# Patient Record
Sex: Female | Born: 1940 | Race: White | Hispanic: No | Smoking: Never smoker
Health system: Southern US, Community
[De-identification: ages and names within clinical notes are randomized; demographics above are authoritative.]

## PROBLEM LIST (undated history)

## (undated) DIAGNOSIS — M419 Scoliosis, unspecified: Secondary | ICD-10-CM

## (undated) DIAGNOSIS — M1990 Unspecified osteoarthritis, unspecified site: Secondary | ICD-10-CM

## (undated) DIAGNOSIS — Z860100 Personal history of colon polyps, unspecified: Secondary | ICD-10-CM

## (undated) DIAGNOSIS — E039 Hypothyroidism, unspecified: Secondary | ICD-10-CM

## (undated) DIAGNOSIS — I10 Essential (primary) hypertension: Secondary | ICD-10-CM

## (undated) DIAGNOSIS — K259 Gastric ulcer, unspecified as acute or chronic, without hemorrhage or perforation: Secondary | ICD-10-CM

## (undated) DIAGNOSIS — Z87440 Personal history of urinary (tract) infections: Secondary | ICD-10-CM

## (undated) DIAGNOSIS — E119 Type 2 diabetes mellitus without complications: Secondary | ICD-10-CM

## (undated) DIAGNOSIS — K219 Gastro-esophageal reflux disease without esophagitis: Secondary | ICD-10-CM

## (undated) DIAGNOSIS — M21379 Foot drop, unspecified foot: Secondary | ICD-10-CM

## (undated) DIAGNOSIS — E785 Hyperlipidemia, unspecified: Secondary | ICD-10-CM

## (undated) HISTORY — PX: CERVICAL CONE BIOPSY: SUR198

## (undated) HISTORY — PX: CARPAL TUNNEL RELEASE: SHX101

## (undated) HISTORY — PX: OTHER SURGICAL HISTORY: SHX169

---

## 1992-08-20 HISTORY — PX: ABDOMINAL HYSTERECTOMY: SHX81

## 1992-08-20 HISTORY — PX: BREAST SURGERY: SHX581

## 1994-08-20 HISTORY — PX: BACK SURGERY: SHX140

## 1998-08-20 HISTORY — PX: HERNIA REPAIR: SHX51

## 2003-08-21 HISTORY — PX: THYROIDECTOMY: SHX17

## 2005-08-20 HISTORY — PX: OTHER SURGICAL HISTORY: SHX169

## 2008-07-20 HISTORY — PX: ESOPHAGOGASTRODUODENOSCOPY: SHX1529

## 2008-08-20 HISTORY — PX: GIVENS CAPSULE STUDY: SHX5432

## 2012-04-20 HISTORY — PX: COLONOSCOPY: SHX174

## 2012-04-29 ENCOUNTER — Encounter: Payer: Self-pay | Admitting: Internal Medicine

## 2012-09-04 ENCOUNTER — Other Ambulatory Visit: Payer: Self-pay | Admitting: Neurosurgery

## 2012-09-05 ENCOUNTER — Encounter (HOSPITAL_COMMUNITY): Payer: Self-pay | Admitting: Pharmacy Technician

## 2012-09-15 ENCOUNTER — Encounter (HOSPITAL_COMMUNITY): Payer: Self-pay

## 2012-09-15 ENCOUNTER — Ambulatory Visit (HOSPITAL_COMMUNITY): Admission: RE | Admit: 2012-09-15 | Payer: Medicare Other | Source: Ambulatory Visit

## 2012-09-15 ENCOUNTER — Encounter (HOSPITAL_COMMUNITY)
Admission: RE | Admit: 2012-09-15 | Discharge: 2012-09-15 | Disposition: A | Discharge status: Home / Self Care | Payer: Medicare Other | Source: Ambulatory Visit | Attending: Neurosurgery | Admitting: Neurosurgery

## 2012-09-15 ENCOUNTER — Encounter (HOSPITAL_COMMUNITY): Admission: RE | Admit: 2012-09-15 | Payer: Medicare Other | Source: Ambulatory Visit

## 2012-09-15 HISTORY — DX: Unspecified osteoarthritis, unspecified site: M19.90

## 2012-09-15 HISTORY — DX: Hypothyroidism, unspecified: E03.9

## 2012-09-15 HISTORY — DX: Gastric ulcer, unspecified as acute or chronic, without hemorrhage or perforation: K25.9

## 2012-09-15 HISTORY — DX: Scoliosis, unspecified: M41.9

## 2012-09-15 HISTORY — DX: Essential (primary) hypertension: I10

## 2012-09-15 HISTORY — DX: Hyperlipidemia, unspecified: E78.5

## 2012-09-15 HISTORY — DX: Personal history of urinary (tract) infections: Z87.440

## 2012-09-15 HISTORY — DX: Type 2 diabetes mellitus without complications: E11.9

## 2012-09-15 HISTORY — DX: Personal history of colonic polyps: Z86.010

## 2012-09-15 HISTORY — DX: Personal history of colon polyps, unspecified: Z86.0100

## 2012-09-15 HISTORY — DX: Gastro-esophageal reflux disease without esophagitis: K21.9

## 2012-09-15 HISTORY — DX: Foot drop, unspecified foot: M21.379

## 2012-09-15 LAB — CBC
HCT: 35 % — ABNORMAL LOW (ref 36.0–46.0)
Hemoglobin: 11.8 g/dL — ABNORMAL LOW (ref 12.0–15.0)
MCHC: 33.7 g/dL (ref 30.0–36.0)
RBC: 4.04 MIL/uL (ref 3.87–5.11)
WBC: 5.7 10*3/uL (ref 4.0–10.5)

## 2012-09-15 LAB — BASIC METABOLIC PANEL
BUN: 17 mg/dL (ref 6–23)
Chloride: 101 mEq/L (ref 96–112)
GFR calc Af Amer: 80 mL/min — ABNORMAL LOW (ref >90)
GFR calc non Af Amer: 69 mL/min — ABNORMAL LOW (ref >90)
Potassium: 4.1 mEq/L (ref 3.5–5.1)
Sodium: 137 mEq/L (ref 135–145)

## 2012-09-15 LAB — SURGICAL PCR SCREEN
MRSA, PCR: NEGATIVE
Staphylococcus aureus: NEGATIVE

## 2012-09-15 LAB — ABO/RH: ABO/RH(D): A POS

## 2012-09-15 LAB — TYPE AND SCREEN
ABO/RH(D): A POS
Antibody Screen: NEGATIVE

## 2012-09-15 NOTE — Progress Notes (Addendum)
Pt doesn't have a cardiologist  Denies ever having an echo/stress test/heart cath   EKG and CXR to be requested from Dr.Jaswani--[phone]   Medical MD is Dr.Jaswani

## 2012-09-15 NOTE — Pre-Procedure Instructions (Signed)
Danielle Blair  09/15/2012   Your procedure is scheduled on:  Fri, Jan 31 @ 10:10 AM  Report to [facility] at 7:00 AM.  Call this number if you have problems the morning of surgery: [phone]   Remember:   Do not eat food or drink liquids after midnight.   Take these medicines the morning of surgery with A SIP OF WATER: Synthroid(Levothyroxine) and Pain Pill (if needed)              Stop taking your Aspirin and Fish Oil.No Goody's,Aleve,BC'S,Ibuprofen,or any herbal medication   Do not wear jewelry, make-up or nail polish.  Do not wear lotions, powders, or perfumes. You may wear deodorant.  Do not shave 48 hours prior to surgery.   Do not bring valuables to the hospital.  Contacts, dentures or bridgework may not be worn into surgery.  Leave suitcase in the car. After surgery it may be brought to your room.  For patients admitted to the hospital, checkout time is 11:00 AM the day of  discharge.   Patients discharged the day of surgery will not be allowed to drive  home.  Special Instructions: Shower using CHG 2 nights before surgery and the night before surgery.  If you shower the day of surgery use CHG.  Use special wash - you have one bottle of CHG for all showers.  You should use approximately 1/3 of the bottle for each shower.   Please read over the following fact sheets that you were given: Pain Booklet, Coughing and Deep Breathing, Blood Transfusion Information, MRSA Information and Surgical Site Infection Prevention

## 2012-09-15 NOTE — Progress Notes (Signed)
Pt MD office sent a report of cxr and ekg but not a hard copy of the reports---called back and spoke with Neysa Bonito who will refax the hard copies

## 2012-09-18 MED ORDER — CEFAZOLIN SODIUM-DEXTROSE 2-3 GM-% IV SOLR
2.0000 g | INTRAVENOUS | Status: AC
  Administered 2012-09-19 (×2): 2 g via INTRAVENOUS
  Filled 2012-09-18: qty 50

## 2012-09-19 ENCOUNTER — Inpatient Hospital Stay (HOSPITAL_COMMUNITY): Payer: Medicare Other

## 2012-09-19 ENCOUNTER — Inpatient Hospital Stay (HOSPITAL_COMMUNITY)
Admission: RE | Admit: 2012-09-19 | Discharge: 2012-09-23 | DRG: 457 | Disposition: A | Discharge status: Skilled Nursing Facility | Payer: Medicare Other | Source: Ambulatory Visit | Attending: Neurosurgery | Admitting: Neurosurgery

## 2012-09-19 ENCOUNTER — Encounter (HOSPITAL_COMMUNITY): Payer: Self-pay | Admitting: *Deleted

## 2012-09-19 ENCOUNTER — Encounter (HOSPITAL_COMMUNITY): Payer: Self-pay | Admitting: Anesthesiology

## 2012-09-19 ENCOUNTER — Inpatient Hospital Stay (HOSPITAL_COMMUNITY): Payer: Medicare Other | Admitting: Anesthesiology

## 2012-09-19 ENCOUNTER — Encounter (HOSPITAL_COMMUNITY)
Admission: RE | Disposition: A | Discharge status: Skilled Nursing Facility | Payer: Self-pay | Source: Ambulatory Visit | Attending: Neurosurgery

## 2012-09-19 DIAGNOSIS — M5126 Other intervertebral disc displacement, lumbar region: Secondary | ICD-10-CM | POA: Diagnosis present

## 2012-09-19 DIAGNOSIS — Y831 Surgical operation with implant of artificial internal device as the cause of abnormal reaction of the patient, or of later complication, without mention of misadventure at the time of the procedure: Secondary | ICD-10-CM | POA: Diagnosis present

## 2012-09-19 DIAGNOSIS — M419 Scoliosis, unspecified: Secondary | ICD-10-CM | POA: Diagnosis present

## 2012-09-19 DIAGNOSIS — M216X9 Other acquired deformities of unspecified foot: Secondary | ICD-10-CM | POA: Diagnosis present

## 2012-09-19 DIAGNOSIS — G988 Other disorders of nervous system: Secondary | ICD-10-CM | POA: Diagnosis present

## 2012-09-19 DIAGNOSIS — Y9210 Unspecified residential institution as the place of occurrence of the external cause: Secondary | ICD-10-CM | POA: Diagnosis present

## 2012-09-19 DIAGNOSIS — M4120 Other idiopathic scoliosis, site unspecified: Principal | ICD-10-CM | POA: Diagnosis present

## 2012-09-19 HISTORY — PX: TRANSFORAMINAL LUMBAR INTERBODY FUSION (TLIF) WITH PEDICLE SCREW FIXATION 4 LEVEL: SHX6144

## 2012-09-19 LAB — GLUCOSE, CAPILLARY: Glucose-Capillary: 165 mg/dL — ABNORMAL HIGH (ref 70–99)

## 2012-09-19 SURGERY — TRANSFORAMINAL LUMBAR INTERBODY FUSION (TLIF) WITH PEDICLE SCREW FIXATION 4 LEVEL
Anesthesia: General | Site: Back | Wound class: Clean

## 2012-09-19 MED ORDER — VITAMIN D-3 25 MCG (1000 UT) PO CAPS: 2.0000 | ORAL_CAPSULE | Freq: Every day | ORAL | Status: DC

## 2012-09-19 MED ORDER — PHENYLEPHRINE HCL 10 MG/ML IJ SOLN
INTRAMUSCULAR | Status: DC | PRN
  Administered 2012-09-19: 40 ug via INTRAVENOUS
  Administered 2012-09-19 (×2): 80 ug via INTRAVENOUS
  Administered 2012-09-19: 120 ug via INTRAVENOUS
  Administered 2012-09-19 (×3): 80 ug via INTRAVENOUS

## 2012-09-19 MED ORDER — LIDOCAINE-EPINEPHRINE 1 %-1:100000 IJ SOLN
INTRAMUSCULAR | Status: DC | PRN
  Administered 2012-09-19: 5 mL

## 2012-09-19 MED ORDER — KCL IN DEXTROSE-NACL 20-5-0.45 MEQ/L-%-% IV SOLN
INTRAVENOUS | Status: DC
  Administered 2012-09-19 – 2012-09-21 (×2): via INTRAVENOUS
  Filled 2012-09-19 (×4): qty 1000

## 2012-09-19 MED ORDER — HYDROMORPHONE HCL PF 1 MG/ML IJ SOLN
INTRAMUSCULAR | Status: AC
  Administered 2012-09-19: 0.5 mg via INTRAVENOUS
  Filled 2012-09-19: qty 1

## 2012-09-19 MED ORDER — SODIUM CHLORIDE 0.9 % IJ SOLN: 9.0000 mL | INTRAMUSCULAR | Status: DC | PRN

## 2012-09-19 MED ORDER — VITAMIN D3 25 MCG (1000 UNIT) PO TABS
2000.0000 [IU] | ORAL_TABLET | Freq: Every day | ORAL | Status: DC
  Administered 2012-09-19 – 2012-09-23 (×5): 2000 [IU] via ORAL
  Filled 2012-09-19 (×5): qty 2

## 2012-09-19 MED ORDER — SODIUM CHLORIDE 0.9 % IV SOLN: 250.0000 mL | INTRAVENOUS | Status: DC

## 2012-09-19 MED ORDER — ASPIRIN EC 81 MG PO TBEC
81.0000 mg | DELAYED_RELEASE_TABLET | Freq: Every day | ORAL | Status: DC
  Administered 2012-09-19 – 2012-09-23 (×5): 81 mg via ORAL
  Filled 2012-09-19 (×5): qty 1

## 2012-09-19 MED ORDER — HYDROMORPHONE HCL PF 1 MG/ML IJ SOLN
INTRAMUSCULAR | Status: DC | PRN
  Administered 2012-09-19: 1 mg via INTRAVENOUS

## 2012-09-19 MED ORDER — LIDOCAINE HCL (CARDIAC) 20 MG/ML IV SOLN
INTRAVENOUS | Status: DC | PRN
  Administered 2012-09-19: 80 mg via INTRAVENOUS

## 2012-09-19 MED ORDER — FENOFIBRATE 54 MG PO TABS
54.0000 mg | ORAL_TABLET | Freq: Every day | ORAL | Status: DC
  Administered 2012-09-19 – 2012-09-23 (×5): 54 mg via ORAL
  Filled 2012-09-19 (×5): qty 1

## 2012-09-19 MED ORDER — VITAMIN B-12 1000 MCG PO TABS
1000.0000 ug | ORAL_TABLET | Freq: Every day | ORAL | Status: DC
  Administered 2012-09-19 – 2012-09-23 (×5): 1000 ug via ORAL
  Filled 2012-09-19 (×5): qty 1

## 2012-09-19 MED ORDER — SENNOSIDES-DOCUSATE SODIUM 8.6-50 MG PO TABS: 1.0000 | ORAL_TABLET | Freq: Every evening | ORAL | Status: DC | PRN

## 2012-09-19 MED ORDER — LORATADINE 10 MG PO TABS
10.0000 mg | ORAL_TABLET | Freq: Every day | ORAL | Status: DC
  Administered 2012-09-19 – 2012-09-23 (×5): 10 mg via ORAL
  Filled 2012-09-19 (×5): qty 1

## 2012-09-19 MED ORDER — DIPHENHYDRAMINE HCL 50 MG/ML IJ SOLN
12.5000 mg | Freq: Four times a day (QID) | INTRAMUSCULAR | Status: DC | PRN
  Administered 2012-09-20: 12.5 mg via INTRAVENOUS
  Filled 2012-09-19: qty 1

## 2012-09-19 MED ORDER — HYDROMORPHONE HCL PF 1 MG/ML IJ SOLN
INTRAMUSCULAR | Status: AC
  Filled 2012-09-19: qty 1

## 2012-09-19 MED ORDER — ACETAMINOPHEN 650 MG RE SUPP: 650.0000 mg | RECTAL | Status: DC | PRN

## 2012-09-19 MED ORDER — ZOLPIDEM TARTRATE 5 MG PO TABS: 5.0000 mg | ORAL_TABLET | Freq: Every evening | ORAL | Status: DC | PRN

## 2012-09-19 MED ORDER — ACETAMINOPHEN 10 MG/ML IV SOLN
INTRAVENOUS | Status: AC
  Administered 2012-09-19: 1000 mg via INTRAVENOUS
  Filled 2012-09-19: qty 100

## 2012-09-19 MED ORDER — ONDANSETRON HCL 4 MG/2ML IJ SOLN
INTRAMUSCULAR | Status: DC | PRN
  Administered 2012-09-19: 4 mg via INTRAVENOUS

## 2012-09-19 MED ORDER — INSULIN ASPART 100 UNIT/ML ~~LOC~~ SOLN
0.0000 [IU] | Freq: Three times a day (TID) | SUBCUTANEOUS | Status: DC
  Administered 2012-09-20 – 2012-09-23 (×4): 2 [IU] via SUBCUTANEOUS

## 2012-09-19 MED ORDER — PHENOL 1.4 % MT LIQD: 1.0000 | OROMUCOSAL | Status: DC | PRN

## 2012-09-19 MED ORDER — MIDAZOLAM HCL 5 MG/5ML IJ SOLN
INTRAMUSCULAR | Status: DC | PRN
  Administered 2012-09-19 (×2): 1 mg via INTRAVENOUS

## 2012-09-19 MED ORDER — GLIPIZIDE ER 2.5 MG PO TB24
2.5000 mg | ORAL_TABLET | Freq: Every day | ORAL | Status: DC
  Administered 2012-09-19 – 2012-09-23 (×5): 2.5 mg via ORAL
  Filled 2012-09-19 (×5): qty 1

## 2012-09-19 MED ORDER — VECURONIUM BROMIDE 10 MG IV SOLR
INTRAVENOUS | Status: DC | PRN
  Administered 2012-09-19 (×5): 1 mg via INTRAVENOUS
  Administered 2012-09-19: 2 mg via INTRAVENOUS
  Administered 2012-09-19 (×5): 1 mg via INTRAVENOUS

## 2012-09-19 MED ORDER — LISINOPRIL 2.5 MG PO TABS
2.5000 mg | ORAL_TABLET | Freq: Every day | ORAL | Status: DC
  Administered 2012-09-19 – 2012-09-23 (×3): 2.5 mg via ORAL
  Filled 2012-09-19 (×5): qty 1

## 2012-09-19 MED ORDER — DIPHENHYDRAMINE HCL 12.5 MG/5ML PO ELIX: 12.5000 mg | ORAL_SOLUTION | Freq: Four times a day (QID) | ORAL | Status: DC | PRN

## 2012-09-19 MED ORDER — BUPIVACAINE HCL (PF) 0.5 % IJ SOLN
INTRAMUSCULAR | Status: DC | PRN
  Administered 2012-09-19: 5 mL

## 2012-09-19 MED ORDER — 0.9 % SODIUM CHLORIDE (POUR BTL) OPTIME
TOPICAL | Status: DC | PRN
  Administered 2012-09-19: 1000 mL

## 2012-09-19 MED ORDER — SODIUM CHLORIDE 0.9 % IJ SOLN: 3.0000 mL | INTRAMUSCULAR | Status: DC | PRN

## 2012-09-19 MED ORDER — NEOSTIGMINE METHYLSULFATE 1 MG/ML IJ SOLN
INTRAMUSCULAR | Status: DC | PRN
  Administered 2012-09-19: 3.5 mg via INTRAVENOUS

## 2012-09-19 MED ORDER — MENTHOL 3 MG MT LOZG: 1.0000 | LOZENGE | OROMUCOSAL | Status: DC | PRN

## 2012-09-19 MED ORDER — DOCUSATE SODIUM 100 MG PO CAPS
100.0000 mg | ORAL_CAPSULE | Freq: Two times a day (BID) | ORAL | Status: DC
  Administered 2012-09-19 – 2012-09-23 (×8): 100 mg via ORAL
  Filled 2012-09-19 (×5): qty 1

## 2012-09-19 MED ORDER — CEFAZOLIN SODIUM 1-5 GM-% IV SOLN
1.0000 g | Freq: Three times a day (TID) | INTRAVENOUS | Status: AC
  Administered 2012-09-19 – 2012-09-20 (×2): 1 g via INTRAVENOUS
  Filled 2012-09-19 (×2): qty 50

## 2012-09-19 MED ORDER — ALBUMIN HUMAN 5 % IV SOLN
INTRAVENOUS | Status: DC | PRN
  Administered 2012-09-19: 13:00:00 via INTRAVENOUS

## 2012-09-19 MED ORDER — MORPHINE SULFATE (PF) 1 MG/ML IV SOLN
INTRAVENOUS | Status: AC
  Filled 2012-09-19: qty 25

## 2012-09-19 MED ORDER — BISACODYL 10 MG RE SUPP
10.0000 mg | Freq: Every day | RECTAL | Status: DC | PRN
  Administered 2012-09-20: 10 mg via RECTAL
  Filled 2012-09-19: qty 1

## 2012-09-19 MED ORDER — ONDANSETRON HCL 4 MG/2ML IJ SOLN: 4.0000 mg | Freq: Four times a day (QID) | INTRAMUSCULAR | Status: DC | PRN

## 2012-09-19 MED ORDER — DIAZEPAM 5 MG PO TABS
5.0000 mg | ORAL_TABLET | Freq: Four times a day (QID) | ORAL | Status: DC | PRN
  Administered 2012-09-20 – 2012-09-21 (×2): 5 mg via ORAL
  Filled 2012-09-19 (×2): qty 1

## 2012-09-19 MED ORDER — METFORMIN HCL 500 MG PO TABS
1000.0000 mg | ORAL_TABLET | Freq: Two times a day (BID) | ORAL | Status: DC
  Administered 2012-09-20 – 2012-09-23 (×7): 1000 mg via ORAL
  Filled 2012-09-19 (×9): qty 2

## 2012-09-19 MED ORDER — NALOXONE HCL 0.4 MG/ML IJ SOLN: 0.4000 mg | INTRAMUSCULAR | Status: DC | PRN

## 2012-09-19 MED ORDER — HYDROCODONE-ACETAMINOPHEN 5-325 MG PO TABS: 1.0000 | ORAL_TABLET | ORAL | Status: DC | PRN

## 2012-09-19 MED ORDER — OXYCODONE-ACETAMINOPHEN 5-325 MG PO TABS
1.0000 | ORAL_TABLET | ORAL | Status: DC | PRN
  Administered 2012-09-20 – 2012-09-21 (×2): 2 via ORAL
  Filled 2012-09-19 (×3): qty 2

## 2012-09-19 MED ORDER — SODIUM CHLORIDE 0.9 % IJ SOLN: 3.0000 mL | Freq: Two times a day (BID) | INTRAMUSCULAR | Status: DC

## 2012-09-19 MED ORDER — ROCURONIUM BROMIDE 100 MG/10ML IV SOLN
INTRAVENOUS | Status: DC | PRN
  Administered 2012-09-19: 40 mg via INTRAVENOUS
  Administered 2012-09-19: 10 mg via INTRAVENOUS

## 2012-09-19 MED ORDER — ONDANSETRON HCL 4 MG/2ML IJ SOLN: 4.0000 mg | INTRAMUSCULAR | Status: DC | PRN

## 2012-09-19 MED ORDER — GLYCOPYRROLATE 0.2 MG/ML IJ SOLN
INTRAMUSCULAR | Status: DC | PRN
  Administered 2012-09-19: .5 mg via INTRAVENOUS

## 2012-09-19 MED ORDER — FENTANYL CITRATE 0.05 MG/ML IJ SOLN
INTRAMUSCULAR | Status: DC | PRN
  Administered 2012-09-19: 25 ug via INTRAVENOUS
  Administered 2012-09-19: 150 ug via INTRAVENOUS
  Administered 2012-09-19: 50 ug via INTRAVENOUS
  Administered 2012-09-19: 25 ug via INTRAVENOUS
  Administered 2012-09-19 (×2): 50 ug via INTRAVENOUS
  Administered 2012-09-19: 25 ug via INTRAVENOUS

## 2012-09-19 MED ORDER — PROPOFOL 10 MG/ML IV BOLUS
INTRAVENOUS | Status: DC | PRN
  Administered 2012-09-19: 170 mg via INTRAVENOUS

## 2012-09-19 MED ORDER — LEVOTHYROXINE SODIUM 125 MCG PO TABS
125.0000 ug | ORAL_TABLET | Freq: Every day | ORAL | Status: DC
  Administered 2012-09-20 – 2012-09-23 (×4): 125 ug via ORAL
  Filled 2012-09-19 (×6): qty 1

## 2012-09-19 MED ORDER — THROMBIN 20000 UNITS EX SOLR
CUTANEOUS | Status: DC | PRN
  Administered 2012-09-19: 11:00:00 via TOPICAL

## 2012-09-19 MED ORDER — CEFAZOLIN SODIUM-DEXTROSE 2-3 GM-% IV SOLR
INTRAVENOUS | Status: AC
  Filled 2012-09-19: qty 50

## 2012-09-19 MED ORDER — SENNA 8.6 MG PO TABS
1.0000 | ORAL_TABLET | Freq: Two times a day (BID) | ORAL | Status: DC
  Administered 2012-09-19 – 2012-09-23 (×8): 8.6 mg via ORAL
  Filled 2012-09-19 (×8): qty 1

## 2012-09-19 MED ORDER — HYDROMORPHONE HCL PF 1 MG/ML IJ SOLN
0.2500 mg | INTRAMUSCULAR | Status: DC | PRN
  Administered 2012-09-19 (×3): 0.5 mg via INTRAVENOUS

## 2012-09-19 MED ORDER — ALUM & MAG HYDROXIDE-SIMETH 200-200-20 MG/5ML PO SUSP: 30.0000 mL | Freq: Four times a day (QID) | ORAL | Status: DC | PRN

## 2012-09-19 MED ORDER — INSULIN ASPART 100 UNIT/ML ~~LOC~~ SOLN: 0.0000 [IU] | Freq: Every day | SUBCUTANEOUS | Status: DC

## 2012-09-19 MED ORDER — LACTATED RINGERS IV SOLN: INTRAVENOUS | Status: DC | PRN

## 2012-09-19 MED ORDER — MORPHINE SULFATE (PF) 1 MG/ML IV SOLN
INTRAVENOUS | Status: DC
Start: 2012-09-19 — End: 2012-09-20
  Administered 2012-09-19: via INTRAVENOUS
  Administered 2012-09-19: 6 mg via INTRAVENOUS
  Administered 2012-09-19: 17:00:00 via INTRAVENOUS
  Administered 2012-09-19: 17.29 mg via INTRAVENOUS
  Administered 2012-09-20: 9 mg via INTRAVENOUS
  Administered 2012-09-20: 10.5 mg via INTRAVENOUS
  Filled 2012-09-19: qty 25

## 2012-09-19 MED ORDER — FLEET ENEMA 7-19 GM/118ML RE ENEM: 1.0000 | ENEMA | Freq: Once | RECTAL | Status: AC | PRN

## 2012-09-19 MED ORDER — ACETAMINOPHEN 325 MG PO TABS: 650.0000 mg | ORAL_TABLET | ORAL | Status: DC | PRN

## 2012-09-19 MED ORDER — LACTATED RINGERS IV SOLN
INTRAVENOUS | Status: DC | PRN
  Administered 2012-09-19 (×3): via INTRAVENOUS

## 2012-09-19 MED ORDER — OXYCODONE-ACETAMINOPHEN 5-325 MG PO TABS: 1.0000 | ORAL_TABLET | ORAL | Status: DC | PRN

## 2012-09-19 MED ORDER — BACITRACIN ZINC 500 UNIT/GM EX OINT
TOPICAL_OINTMENT | CUTANEOUS | Status: DC | PRN
  Administered 2012-09-19: 1 via TOPICAL

## 2012-09-19 SURGICAL SUPPLY — 82 items
BAG DECANTER FOR FLEXI CONT (MISCELLANEOUS) IMPLANT
BENZOIN TINCTURE PRP APPL 2/3 (GAUZE/BANDAGES/DRESSINGS) ×2 IMPLANT
BLADE SURG ROTATE 9660 (MISCELLANEOUS) IMPLANT
BONE VOID FILLER STRIP 10CC (Bone Implant) ×2 IMPLANT
BUR MATCHSTICK NEURO 3.0 LAGG (BURR) ×2 IMPLANT
BUR PRECISION FLUTE 5.0 (BURR) ×2 IMPLANT
CAGE TLIF 8MM (Cage) ×4 IMPLANT
CANISTER SUCTION 2500CC (MISCELLANEOUS) ×2 IMPLANT
CLOTH BEACON ORANGE TIMEOUT ST (SAFETY) ×2 IMPLANT
CONT SPEC 4OZ CLIKSEAL STRL BL (MISCELLANEOUS) ×4 IMPLANT
COVER BACK TABLE 24X17X13 BIG (DRAPES) IMPLANT
COVER TABLE BACK 60X90 (DRAPES) ×2 IMPLANT
DERMABOND ADHESIVE PROPEN (GAUZE/BANDAGES/DRESSINGS) ×1
DERMABOND ADVANCED (GAUZE/BANDAGES/DRESSINGS)
DERMABOND ADVANCED .7 DNX12 (GAUZE/BANDAGES/DRESSINGS) IMPLANT
DERMABOND ADVANCED .7 DNX6 (GAUZE/BANDAGES/DRESSINGS) ×1 IMPLANT
DRAPE C-ARM 42X72 X-RAY (DRAPES) ×6 IMPLANT
DRAPE LAPAROTOMY 100X72X124 (DRAPES) ×2 IMPLANT
DRAPE POUCH INSTRU U-SHP 10X18 (DRAPES) ×2 IMPLANT
DRAPE SURG 17X23 STRL (DRAPES) ×2 IMPLANT
DRESSING TELFA 8X3 (GAUZE/BANDAGES/DRESSINGS) ×2 IMPLANT
DURAFORM COLLAGEN 1X1 5-PACK (Neuro Prosthesis/Implant) ×4 IMPLANT
DURAPREP 26ML APPLICATOR (WOUND CARE) ×2 IMPLANT
ELECT REM PT RETURN 9FT ADLT (ELECTROSURGICAL) ×2
ELECTRODE REM PT RTRN 9FT ADLT (ELECTROSURGICAL) ×1 IMPLANT
EVACUATOR 1/8 PVC DRAIN (DRAIN) ×2 IMPLANT
GAUZE SPONGE 4X4 16PLY XRAY LF (GAUZE/BANDAGES/DRESSINGS) IMPLANT
GLOVE BIO SURGEON STRL SZ8 (GLOVE) ×4 IMPLANT
GLOVE BIOGEL PI IND STRL 7.0 (GLOVE) ×2 IMPLANT
GLOVE BIOGEL PI IND STRL 8 (GLOVE) ×4 IMPLANT
GLOVE BIOGEL PI IND STRL 8.5 (GLOVE) ×3 IMPLANT
GLOVE BIOGEL PI INDICATOR 7.0 (GLOVE) ×2
GLOVE BIOGEL PI INDICATOR 8 (GLOVE) ×4
GLOVE BIOGEL PI INDICATOR 8.5 (GLOVE) ×3
GLOVE ECLIPSE 8.0 STRL XLNG CF (GLOVE) ×8 IMPLANT
GLOVE ECLIPSE 8.5 STRL (GLOVE) ×2 IMPLANT
GLOVE EXAM NITRILE LRG STRL (GLOVE) IMPLANT
GLOVE EXAM NITRILE MD LF STRL (GLOVE) IMPLANT
GLOVE EXAM NITRILE XL STR (GLOVE) IMPLANT
GLOVE EXAM NITRILE XS STR PU (GLOVE) IMPLANT
GLOVE SURG SS PI 6.5 STRL IVOR (GLOVE) ×10 IMPLANT
GOWN BRE IMP SLV AUR LG STRL (GOWN DISPOSABLE) IMPLANT
GOWN BRE IMP SLV AUR XL STRL (GOWN DISPOSABLE) ×8 IMPLANT
GOWN STRL REIN 2XL LVL4 (GOWN DISPOSABLE) ×8 IMPLANT
KIT BASIN OR (CUSTOM PROCEDURE TRAY) ×2 IMPLANT
KIT INFUSE MEDIUM (Orthopedic Implant) ×2 IMPLANT
KIT POSITION SURG JACKSON T1 (MISCELLANEOUS) ×2 IMPLANT
KIT ROOM TURNOVER OR (KITS) ×2 IMPLANT
MILL MEDIUM DISP (BLADE) ×2 IMPLANT
NEEDLE HYPO 25X1 1.5 SAFETY (NEEDLE) ×2 IMPLANT
NEEDLE SPNL 18GX3.5 QUINCKE PK (NEEDLE) IMPLANT
NS IRRIG 1000ML POUR BTL (IV SOLUTION) ×2 IMPLANT
PACK LAMINECTOMY NEURO (CUSTOM PROCEDURE TRAY) ×2 IMPLANT
PAD ARMBOARD 7.5X6 YLW CONV (MISCELLANEOUS) ×6 IMPLANT
PATTIES SURGICAL .5 X.5 (GAUZE/BANDAGES/DRESSINGS) IMPLANT
PATTIES SURGICAL .5 X1 (DISPOSABLE) IMPLANT
PATTIES SURGICAL 1X1 (DISPOSABLE) IMPLANT
ROD CP TI 5.5X11CM (Rod) ×4 IMPLANT
SCREW 50MM (Screw) ×14 IMPLANT
SCREW PEDICLE 45MM (Screw) ×4 IMPLANT
SCREW POLYAX 6.5X45MM (Screw) ×2 IMPLANT
SCREW SET SPINAL STD HEXALOBE (Screw) ×22 IMPLANT
SPONGE GAUZE 4X4 12PLY (GAUZE/BANDAGES/DRESSINGS) ×2 IMPLANT
SPONGE LAP 4X18 X RAY DECT (DISPOSABLE) ×2 IMPLANT
SPONGE SURGIFOAM ABS GEL 100 (HEMOSTASIS) ×2 IMPLANT
STAPLER SKIN PROX WIDE 3.9 (STAPLE) IMPLANT
STRIP CLOSURE SKIN 1/2X4 (GAUZE/BANDAGES/DRESSINGS) ×2 IMPLANT
SUT ETHILON 3 0 FSL (SUTURE) ×2 IMPLANT
SUT VIC AB 1 CT1 18XBRD ANBCTR (SUTURE) ×2 IMPLANT
SUT VIC AB 1 CT1 8-18 (SUTURE) ×2
SUT VIC AB 2-0 CT1 18 (SUTURE) ×4 IMPLANT
SUT VIC AB 3-0 SH 8-18 (SUTURE) ×4 IMPLANT
SYR 20CC LL (SYRINGE) ×2 IMPLANT
SYR 3ML LL SCALE MARK (SYRINGE) ×8 IMPLANT
SYR 5ML LL (SYRINGE) IMPLANT
TAPE CLOTH SURG 4X10 WHT LF (GAUZE/BANDAGES/DRESSINGS) ×2 IMPLANT
TLIF 9MM (Spine Construct) ×2 IMPLANT
TOWEL OR 17X24 6PK STRL BLUE (TOWEL DISPOSABLE) ×2 IMPLANT
TOWEL OR 17X26 10 PK STRL BLUE (TOWEL DISPOSABLE) ×2 IMPLANT
TRAP SPECIMEN MUCOUS 40CC (MISCELLANEOUS) ×2 IMPLANT
TRAY FOLEY CATH 14FRSI W/METER (CATHETERS) ×2 IMPLANT
WATER STERILE IRR 1000ML POUR (IV SOLUTION) ×2 IMPLANT

## 2012-09-19 NOTE — Brief Op Note (Signed)
09/19/2012  4:33 PM  PATIENT:  Danielle Blair  72 y.o. 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 She has a severe left L5 radiculopathy with foot drop related to her prior laminectomy surgery. It was elected to take her to surgery for redo decompression and fusion from L2/3 through L5/S1 levels.   Procedure: Patient was placed in a prone position on the [place] table after smooth and uncomplicated induction of general endotracheal anesthesia. Her low back was prepped and draped in usual sterile fashion with betadine scrub and DuraPrep. Area of incision was infiltrated  with local lidocaine. Incision was made to the lumbodorsal fascia was incised and exposure was performed of the L2-S1 spinous processes laminae facet joint and transverse processes. Intraoperative x-ray was obtained which confirmed correct orientation with marker probes at L2 through sacral levels. A total laminectomy of L2 through L5 levels was performed with disarticulation of the facet joints and thorough decompression was performed of left L2, L3, L4, L5, S1   S1 nerve roots along with the common dural tube. Decompression was greater than would be typical for PLIF. A thorough discectomy with removal  of fragments at L3/4 and L4/5 level was performed.  There was dense scar tissue around the L5 nerve root on the left.   After trial sizing and utilization of sequential shavers, interspaces were packed with BMP, autograft and PEEK cages with NexOss Bone graft extender.  Bone autograft was packed within the interspace bilaterally along with medium BMP kit and NexOss bone graft extender. TLIF medium 9 mm peek cage was packed with BMP and extender and was inserted the interspace and countersunk appropriately at the L45 level, 8 mm cage at L 4/5, and 8 mm cages at  L3/4 levels. 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  This was covered with Duragen.  There was no evidence of persistent CSF leakage. This dural defect was not felt to be repairable due to its location along the nerve root sleeve. There was a densely calcified disc at L 23, so after thorough decompression, it was elected not to place an interbody graft at this level. There was good correction   of scoliotic curvature. The posterolateral region was extensively decorticated and pedicle probes were placed at L2 through S1 bilaterally. Intraoperative fluoroscopy confirmed correct orientationin the AP and lateral plane. 50 x 6.5 mm pedicle screws were placed at S1 bilaterally  and 50 x 6.5 mm screws placed at L 3, L 4, L 5  bilaterally and 6.5 x 50mm screws were placed at L 2 on the right and 6.5 x 45 mm screw was placed at L 2 on the left.  Final x-rays demonstrated well-positioned interbody grafts and pedicle screw fixation. A 110 mm lordotic rod was placed on the right and a 110 mm rod was placed on the left locked down in situ and the posterolateral region was packed with the remaining NexOss and bone graft extender on the right  Along with bone autograft on the left. The wound was irrigated. A medium Hemovac drain was placed and anchored with a stitch. Fascia was closed with 1 Vicryl sutures skin edges were reapproximated 2 and 3-0 Vicryl sutures, followed by 3-0 nylon stitch to reapproximate the skin edges.The wound was dressed with bacitracin, Telfa gauze and tape the patient was extubated in the operating room and taken to recovery in stable satisfactory condition. She tolerated traction well counts were correct at the end of the case.   PLAN OF CARE: Admit to inpatient   PATIENT DISPOSITION:  PACU - hemodynamically stable.   Delay start of Pharmacological VTE agent (>24hrs) due to surgical blood loss or risk of bleeding: yes  

## 2012-09-19 NOTE — H&P (Signed)
Danielle Blair. Danielle Blair  #[id number] DOB:  11-26-1940   09/03/2012:     Danielle Blair comes in today.  She is quite miserable.  We went over her MRI of her lumbar spine which demonstrates transitional anatomy with advanced multilevel degenerative changes.  At L1-2, there is felt to be disc desiccation and superimposed central protrusion nearly completely effacing the anterior thecal sac.  At L2-3, there is moderate biforaminal narrowing right greater than left with moderate to severe central stenosis.  At L3-4, there is diffuse disc bulging superimposed left lateral and left foraminal broad-based protrusions causing moderate right and severe left foraminal narrowing.  At L4-5, there is disc space narrowing with superimposed broad-based left paracentral and left foraminal protrusion causing moderate right and moderately severe left foraminal narrowing.    Based on my review of her imaging including her plain radiographs she has evidence of scoliosis and significant degeneration L2-S1 level with significant neural foraminal stenosis.  She has had previous laminectomy.  We talked about treatment options. She says she cannot live with her current level of pain and wants to get something done.  Based on my review of her studies and her physical examination, I have recommended that she undergo re-do decompression and fusion L2-S1 levels.  She is fitted for an LSO brace.  We went over the surgery in detail with her.  She has had two Medrol Dosepaks which did not give her a great deal of relief.  She said she fell in October and had a right hand injury without fracture.  She said she continues to have severe pain in the low back, left thigh to her calf and is cold below her left Blair.  She is having to take Percocet 5/325 1-2 every four hours as needed for pain.    I went over the surgical procedure with the patient in great detail today.  I went over the diagnostic studies, as well as surgical models.  We discussed the  potential risks and benefits of the surgery, as well as expected hospital course.  The patient would generally wear a brace for three months after surgery.  The risks include, but are not limited to, bleeding, possible need for transfusion, infection, damage to nerves and vessels, risks of anesthesia, dural tear, injury to lumbar nerve roots causing either temporary or permanent leg pain, numbness or weakness, malpositioning of instrumentation, fusion failure, failure to relieve pain, back pain after surgery, recurrent disc herniation, worsening of pain and adjacent degeneration after a spinal fusion.  Also, the potential for the need for further surgery in the event of incomplete fusion.  We also discussed the risks of injury to abdominal structures including bowel, iliac artery or vein injury.    Risks and benefits were discussed and she wishes to proceed.  I met with the patient at great length and later she met with Danielle Cocker, RN to discuss treatment plans and my recommendations.           Danielle Blair. 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  She is Danielle Blair's sister-in-law and Danielle Blair mother-in-law both of whom are patients of mine.  She is a 72 year old retired woman with low back pain.  She says it has been increasing frequently but she has increased pain with periods of activity. She says that occasionally she  gets lightening pain into her left leg and left foot and also tightness in her left foot since 1996.  She says the pain had gotten much worse since 12/14/2011.  She says in 2/96 she had left leg weakness and left footdrop due to a disc.  She uses an AFO since then.  She had disc surgery at [facility] by Dr. Adriana Simas in 2/96 but did not get a lot of improvement in her left foot weakness.  She also underwent left foot surgery in 3/97 and thyroid surgery in 12/2003.  She uses  Advil on an as needed basis.    Danielle Blair said her foot never got better after her back surgery.  She said she had pain after sitting on bleachers on 12/14/2011 and she says that usually she is able to relieve the pain at the lumbosacral junction with extension.    REVIEW OF SYSTEMS:   A detailed Review of Systems sheet was reviewed with the patient.  Pertinent positives include: under eyes-she wears glasses; under Cardiovascular-she notes high cholesterol; under Musculoskeletal-she notes leg weakness and back pain; under Endocrine-she notes diabetes and thyroid dysfunction.  All other systems are negative; this includes Constitutional symptoms, Ears, nose, mouth, throat, Respiratory, Gastrointestinal, Genitourinary, Integumentary & Breast, Neurologic, Psychiatric, Hematologic/Lymphatic, Allergic/Immunologic.    PAST MEDICAL HISTORY:      Current Medical Conditions:    As previously described.  She has a history of noninsulin dependent diabetes mellitus, hypercholesterolemia and thyroid dysfunction.      Prior Operations and Hospitalizations:   As previously described.      Medications and Allergies:  Synthroid 125 mcg q.d., Fenofibrate 145 mg. q.d., Metformin 1000 mg. b.i.d., Januvia 100 mg. q.d., Aspirin 81 mg. q.d., Caltrate 600+D b.i.d., Vitamin D 2000 IU q.d., Vitamin B12 1000 mcg. q.d., Biotin 5000 mcg q.d., Flush Free Niacin 500 mg. 4 q.d., Fish oil 1000 mg. b.i.d. and Cetirizine 10 mg. q.d.  There are no known allergies.    Height and Weight:     5'5" tall and 178 pounds.      FAMILY HISTORY:    Both parents are deceased.  Father died of lung cancer at age 70. Mother died at the age of 42 with high blood pressure and she had a pacemaker.     SOCIAL HISTORY:    She denies tobacco, alcohol or drug use.    DIAGNOSTIC STUDIES:   She has radiographs of the lumbar spine which show levoconvexed scoliosis of her lumbar spine into her thoracic spine with arthritis and facet joint arthropathy L3-S1  levels without significant spondylolisthesis and she has retrolisthesis of L2 on L3 and L3 on L4 with disc degeneration.      PHYSICAL EXAMINATION:      General Appearance:   Danielle Blair is a pleasant and cooperative woman in no acute distress.        Blood Pressure, Pulse, Respirations:   138/80, heart rate 78 and regular, respirations 18.      HEENT - normocephalic, atraumatic.  The pupils are equal, round and reactive to light.  The extraocular muscles are intact.  Sclerae - white.  Conjunctiva - pink.  Oropharynx benign.  Uvula midline.     Neck - there are no masses, meningismus, deformities, tracheal deviation, jugular vein distention or carotid bruits.  There is normal cervical range of motion.  Spurlings' test is negative without reproducible radicular pain turning the patient's head to either side.  Lhermitte's sign is not present with axial compression.  Respiratory - there is normal respiratory effort with good intercostal function.  Lungs are clear to auscultation.  There are no rales, rhonchi or wheezes.      Cardiovascular - the heart has regular rate and rhythm to auscultation.  No murmurs are appreciated.  There is no extremity edema, cyanosis or clubbing.  There are palpable pedal pulses.      Abdomen - soft, nontender, no hepatosplenomegaly appreciated or masses.  There are active bowel sounds.  No guarding or rebound.      Musculoskeletal Examination - she rolls her foot laterally and wears and AFO and limps when she walks. 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  She has a left-sided footdrop.  She   has negative straight leg raise bilaterally.  She has pain at the lumbosacral junction to palpation.    NEUROLOGICAL EXAMINATION: The patient is oriented to time, person and place and has good recall of both recent and remote memory with normal attention span and concentration.  The patient speaks with clear and fluent speech and exhibits normal language function and  appropriate fund of knowledge.      Cranial Nerve Examination - pupils are equal, round and reactive to light.  Extraocular movements are full.  Visual fields are full to confrontational testing.  Facial sensation and facial movement are symmetric and intact.  Hearing is intact to finger rub.  Palate is upgoing.  Shoulder shrug is symmetric.  Tongue protrudes in the midline.      Motor Examination - motor strength is 5/5 in the bilateral deltoids, biceps, triceps, handgrips, wrist extensors, interosseous.  In the lower extremities motor strength is 5/5 in hip flexion, extension, quadriceps, hamstrings, plantar flexion, dorsiflexion and extensor hallucis longus. She has a left footdrop and left hip abductor strength is also decreased at 4+/5.     Sensory Examination - she has decreased pin sensation in the left L5 distribution.      Deep Tendon Reflexes - 3 in the biceps, triceps, and brachioradialis, 3 in the knees, 3 in the ankles.  The great toes are downgoing to plantar stimulation.  No pathologic reflexes.       Cerebellar Examination - normal coordination in upper and lower extremities and normal rapid alternating movements.  Romberg test is negative.    IMPRESSION AND RECOMMENDATIONS: Zaina Jenkin is a 72 year old woman with lumbar scoliosis and a left footdrop.  She has low back pain.  At this point, I have recommended that she try some physical therapy and will see how she does with that.  If she is still having problems, we will go ahead with a new MRI of her lumbar spine.  Otherwise, I do not feel compelled to order that.  She is going to see how she does with that and will followup with me as needed.  I advised her to get on a home exercise program.  I spent 60 minutes in consultation and patient care with this patient.    [facility]    Danielle Blair. Danielle Blair, M.D.

## 2012-09-19 NOTE — Transfer of Care (Signed)
Immediate Anesthesia Transfer of Care Note  Patient: Danielle Blair  Procedure(s) Performed: Procedure(s) (LRB) with comments: TRANSFORAMINAL LUMBAR INTERBODY FUSION (TLIF) WITH PEDICLE SCREW FIXATION 4 LEVEL (N/A) - Lumbar two to sacral one redo laminectomy with a decompression and transforaminal lumbar interbody fusion  Patient Location: PACU  Anesthesia Type:General  Level of Consciousness: awake, alert  and oriented  Airway & Oxygen Therapy: Patient Spontanous Breathing and Patient connected to nasal cannula oxygen  Post-op Assessment: Report given to PACU RN and Post -op Vital signs reviewed and stable  Post vital signs: Reviewed and stable  Complications: No apparent anesthesia complications

## 2012-09-19 NOTE — Progress Notes (Signed)
Awake, alert, conversat.  Full strength except persistent left foot drop.  Doing well.

## 2012-09-19 NOTE — Plan of Care (Signed)
Problem: Consults Goal: Diagnosis - Spinal Surgery Thoraco/Lumbar Spine Fusion     

## 2012-09-19 NOTE — Anesthesia Postprocedure Evaluation (Signed)
  Anesthesia Post-op Note  Patient: Danielle Blair  Procedure(s) Performed: Procedure(s) (LRB) with comments: TRANSFORAMINAL LUMBAR INTERBODY FUSION (TLIF) WITH PEDICLE SCREW FIXATION 4 LEVEL (N/A) - Lumbar two to sacral one redo laminectomy with a decompression and transforaminal lumbar interbody fusion  Patient Location: PACU  Anesthesia Type:General  Level of Consciousness: awake  Airway and Oxygen Therapy: Patient Spontanous Breathing  Post-op Pain: mild  Post-op Assessment: Post-op Vital signs reviewed  Post-op Vital Signs: Reviewed  Complications: No apparent anesthesia complications

## 2012-09-19 NOTE — Progress Notes (Signed)
Confirmed Full dose PCA with Dr. Venetia Maxon.

## 2012-09-19 NOTE — Op Note (Signed)
09/19/2012  4:33 PM  PATIENT:  Danielle Blair  72 y.o. 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 She has a severe left L5 radiculopathy with foot drop related to her prior laminectomy surgery. It was elected to take her to surgery for redo decompression and fusion from L2/3 through L5/S1 levels.   Procedure: Patient was placed in a prone position on the [place] table after smooth and uncomplicated induction of general endotracheal anesthesia. Her low back was prepped and draped in usual sterile fashion with betadine scrub and DuraPrep. Area of incision was infiltrated  with local lidocaine. Incision was made to the lumbodorsal fascia was incised and exposure was performed of the L2-S1 spinous processes laminae facet joint and transverse processes. Intraoperative x-ray was obtained which confirmed correct orientation with marker probes at L2 through sacral levels. A total laminectomy of L2 through L5 levels was performed with disarticulation of the facet joints and thorough decompression was performed of left L2, L3, L4, L5, S1   S1 nerve roots along with the common dural tube. Decompression was greater than would be typical for PLIF. A thorough discectomy with removal  of fragments at L3/4 and L4/5 level was performed.  There was dense scar tissue around the L5 nerve root on the left.   After trial sizing and utilization of sequential shavers, interspaces were packed with BMP, autograft and PEEK cages with NexOss Bone graft extender.  Bone autograft was packed within the interspace bilaterally along with medium BMP kit and NexOss bone graft extender. TLIF medium 9 mm peek cage was packed with BMP and extender and was inserted the interspace and countersunk appropriately at the L45 level, 8 mm cage at L 4/5, and 8 mm cages at  L3/4 levels. 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  This was covered with Duragen.  There was no evidence of persistent CSF leakage. This dural defect was not felt to be repairable due to its location along the nerve root sleeve. There was a densely calcified disc at L 23, so after thorough decompression, it was elected not to place an interbody graft at this level. There was good correction  of scoliotic curvature. The posterolateral region was extensively decorticated and pedicle probes were placed at L2 through S1 bilaterally. Intraoperative fluoroscopy confirmed correct orientationin the AP and lateral plane. 50 x 6.5 mm pedicle screws were placed at S1 bilaterally  and 50 x 6.5 mm screws placed at L 3, L 4, L 5  bilaterally and 6.5 x 50mm screws were placed at L 2 on the right and 6.5 x 45 mm screw was placed at L 2 on the left.  Final x-rays demonstrated well-positioned interbody grafts and pedicle screw fixation. A 110 mm lordotic rod was placed on the right and a 110 mm rod was placed on the left locked down in situ and the posterolateral region was packed with the remaining NexOss and bone graft extender on the right  Along with bone autograft on the left. The wound was irrigated. A medium Hemovac drain was placed and anchored with a stitch. Fascia was closed with 1 Vicryl sutures skin edges were reapproximated 2 and 3-0 Vicryl sutures, followed by 3-0 nylon stitch to reapproximate the skin edges.The wound was dressed with bacitracin, Telfa gauze and tape the patient was extubated in the operating room and taken to recovery in stable satisfactory condition. She tolerated traction well counts were correct at the end of the case.   PLAN OF CARE: Admit to inpatient   PATIENT DISPOSITION:  PACU - hemodynamically stable.   Delay start of Pharmacological VTE agent (>24hrs) due to surgical blood loss or risk of bleeding: yes

## 2012-09-19 NOTE — Anesthesia Preprocedure Evaluation (Addendum)
Anesthesia Evaluation  Patient identified by MRN, date of birth, ID band Patient awake    Reviewed: Allergy & Precautions, H&P , NPO status , Patient's Chart, lab work & pertinent test results  Airway Mallampati: II TM Distance: >3 FB Neck ROM: Full    Dental  (+) Partial Upper   Pulmonary neg pulmonary ROS,  breath sounds clear to auscultation        Cardiovascular hypertension, Pt. on medications Rhythm:Regular Rate:Normal     Neuro/Psych    GI/Hepatic PUD, GERD-  Medicated and Controlled,  Endo/Other  diabetes, Well Controlled, Type 2, Oral Hypoglycemic AgentsHypothyroidism   Renal/GU      Musculoskeletal   Abdominal   Peds  Hematology   Anesthesia Other Findings   Reproductive/Obstetrics                         Anesthesia Physical Anesthesia Plan  ASA: III  Anesthesia Plan: General   Post-op Pain Management:    Induction: Intravenous  Airway Management Planned: Oral ETT  Additional Equipment:   Intra-op Plan:   Post-operative Plan: Extubation in OR  Informed Consent: I have reviewed the patients History and Physical, chart, labs and discussed the procedure including the risks, benefits and alternatives for the proposed anesthesia with the patient or authorized representative who has indicated his/her understanding and acceptance.   Dental advisory given  Plan Discussed with: CRNA, Anesthesiologist and Surgeon  Anesthesia Plan Comments:       Anesthesia Quick Evaluation

## 2012-09-20 DIAGNOSIS — M419 Scoliosis, unspecified: Secondary | ICD-10-CM | POA: Diagnosis present

## 2012-09-20 LAB — HEMOGLOBIN A1C: Mean Plasma Glucose: 137 mg/dL — ABNORMAL HIGH (ref <117)

## 2012-09-20 LAB — GLUCOSE, CAPILLARY
Glucose-Capillary: 106 mg/dL — ABNORMAL HIGH (ref 70–99)
Glucose-Capillary: 110 mg/dL — ABNORMAL HIGH (ref 70–99)

## 2012-09-20 MED ORDER — SODIUM CHLORIDE 0.9 % IV BOLUS (SEPSIS)
500.0000 mL | Freq: Once | INTRAVENOUS | Status: AC
  Administered 2012-09-20: 500 mL via INTRAVENOUS

## 2012-09-20 MED ORDER — OXYCODONE HCL 5 MG PO TABS
10.0000 mg | ORAL_TABLET | ORAL | Status: DC | PRN
  Administered 2012-09-20 – 2012-09-23 (×12): 10 mg via ORAL
  Filled 2012-09-20 (×12): qty 2

## 2012-09-20 MED ORDER — DIPHENHYDRAMINE HCL 50 MG/ML IJ SOLN
12.5000 mg | Freq: Four times a day (QID) | INTRAMUSCULAR | Status: DC | PRN
  Administered 2012-09-20 (×2): 12.5 mg via INTRAVENOUS
  Filled 2012-09-20 (×4): qty 1

## 2012-09-20 MED ORDER — SODIUM CHLORIDE 0.9 % IV BOLUS (SEPSIS): 250.0000 mL | Freq: Once | INTRAVENOUS | Status: DC

## 2012-09-20 MED ORDER — SODIUM CHLORIDE 0.9 % IV SOLN
INTRAVENOUS | Status: AC
  Administered 2012-09-20: 12:00:00 via INTRAVENOUS

## 2012-09-20 NOTE — Progress Notes (Signed)
Subjective: Patient reports Reports itching with PCA pump. The pressure has been on low side.  Objective: Vital signs in last 24 hours: Temp:  [97 F (36.1 C)-98.9 F (37.2 C)] 98.9 F (37.2 C) (02/01 0956) Pulse Rate:  [86-115] 86  (02/01 0956) Resp:  [10-19] 16  (02/01 0956) BP: (76-140)/(46-67) 94/66 mmHg (02/01 1124) SpO2:  [95 %-100 %] 99 % (02/01 0956) FiO2 (%):  [2 %-75 %] 75 % (02/01 0840) Weight:  [79.425 kg (175 lb 1.6 oz)] 79.425 kg (175 lb 1.6 oz) (01/31 2000)  Intake/Output from previous day: 01/31 0701 - 02/01 0700 In: 2520 [I.V.:2100; Blood:170; IV Piggyback:250] Out: 1345 [Urine:450; Drains:395; Blood:500] Intake/Output this shift:    Dressing dry no complaints of headache motor function intact save for a footdrop on the left side  Lab Results: No results found for this basename: WBC:2,HGB:2,HCT:2,PLT:2 in the last 72 hours BMET No results found for this basename: NA:2,K:2,CL:2,CO2:2,GLUCOSE:2,BUN:2,CREATININE:2,CALCIUM:2 in the last 72 hours  Studies/Results: Dg Chest 2 View  09/19/2012  *RADIOLOGY REPORT*  Clinical Data: 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 Preoperative respiratory exam.  CHEST - 2 VIEW  Comparison: None.  Findings: The heart size and pulmonary vascularity are normal and the lungs are clear.  No acute osseous abnormality.  IMPRESSION: No acute disease.   Original Report Authenticated By: Francene Boyers, M.D.    Dg Lumbar Spine 2-3 Views  09/19/2012  *RADIOLOGY REPORT*  Clinical Data: Back pain  DG C-ARM 1-60 MIN,LUMBAR SPINE - 2-3 VIEW  Comparison: None.  Findings: Transitional anatomy is present. The history reports L2- S1 fusion.  Pedicle screws are assumed to be in the L2-S1 vertebral bodies on these AP lateral C-arm films.  Interbody cages are seen at L3-4, L4-5, and L5-S1.  The last disc space which is fused is present at the S1-2.  IMPRESSION: As above peri   Original Report Authenticated By: Davonna Belling, M.D.    Dg Lumbar Spine 1  View  09/19/2012  *RADIOLOGY REPORT*  Clinical Data: Back pain  LUMBAR SPINE - 1 VIEW  Comparison: None.  Findings: Radiograph was taken at 1115 hours.  No prior films are available for comparison.  The L5-S1 level is assumed to be fused. Probes are seen dorsal to L1-2, L2-3, L3-4, L4-5, and L5-S1.  IMPRESSION: Correlate with cross-sectional imaging regarding appropriate placement of instruments.  I do not have cross-sectional imaging available.  The L5-S1 level appears fused and the last open interspace appears to be L4-5.  This radiograph is numbered in accordance with those assumptions.   Original Report Authenticated By: Davonna Belling, M.D.    Dg C-arm 1-60 Min  09/19/2012  *RADIOLOGY REPORT*  Clinical Data: Back pain  DG C-ARM 1-60 MIN,LUMBAR SPINE - 2-3 VIEW  Comparison: None.  Findings: Transitional anatomy is present. The history reports L2- S1 fusion.  Pedicle screws are assumed to be in the L2-S1 vertebral bodies on these AP lateral C-arm films.  Interbody cages are seen at L3-4, L4-5, and L5-S1.  The last disc space which is fused is present at the S1-2.  IMPRESSION: As above peri   Original Report Authenticated By: Davonna Belling, M.D.     Assessment/Plan: Stable postop day 1.  LOS: 1 day  Continue Foley catheter for now secondary to hypotension and immobility with patient flat in bed secondary to CSF leak. DC PCA. Start oral Percocet. 250 cc fluid bolus first blood pressure.   Yisroel Mullendore J 09/20/2012, 11:36 AM

## 2012-09-20 NOTE — Progress Notes (Signed)
At 1600, on usual rounds, patient was found not to be making a lot of urine despite the fluids she has, bladder scan was done but did not show any urine in there. Charge nurse notified, drainage bag was reposition to see if it will help. Patient encouraged to drink a lot of fluids. Will continue to monitor.

## 2012-09-20 NOTE — Progress Notes (Signed)
PT Cancellation Note  Patient Details Name: Danielle Blair MRN: [id number] DOB: 1941-01-07   Cancelled Treatment:    Reason Eval/Treat Not Completed: Medical issues which prohibited therapy.Per  Physician progress note dated today, pt. On bedrest for CSF leak.    Ferman Hamming 09/20/2012, 1:05 PM Weldon Picking PT Acute Rehab Services [phone] [facility] [phone]

## 2012-09-21 LAB — GLUCOSE, CAPILLARY: Glucose-Capillary: 96 mg/dL (ref 70–99)

## 2012-09-21 MED ORDER — DIPHENHYDRAMINE HCL 50 MG/ML IJ SOLN
12.5000 mg | INTRAMUSCULAR | Status: DC | PRN
  Administered 2012-09-21 (×2): 12.5 mg via INTRAVENOUS
  Filled 2012-09-21: qty 1

## 2012-09-21 NOTE — Plan of Care (Signed)
Problem: Consults Goal: Diagnosis - Spinal Surgery Thoraco/Lumbar Spine Fusion     

## 2012-09-21 NOTE — Progress Notes (Signed)
Occupational Therapy Evaluation Patient Details Name: Danielle Blair MRN: [id number] DOB: 01/02/1941 Today's Date: 09/21/2012 Time: 6962-9528 OT Time Calculation (min): 28 min  OT Assessment / Plan / Recommendation Clinical Impression  72 yo s/p L2 - S1 fusion. Back precautions. No brace. Pt c/o dizziness which did not resolve with increased time sitting EOB. BP 113/57. Pt reclined and HOB increased to 45. Will return after lunch to get pt OOB. Pt will benefit from skilled Ot services to max independence with ADL and functional moiblity for ADL to facilitate D/C home with 24/7 S of husband. Pt will need HHOT services at d/C.    OT Assessment  Patient needs continued OT Services    Follow Up Recommendations  Home health OT    Barriers to Discharge None    Equipment Recommendations  None recommended by OT    Recommendations for Other Services    Frequency  Min 3X/week    Precautions / Restrictions Precautions Precautions: Back;Fall Precaution Booklet Issued: Yes (comment) Precaution Comments: AFO - previous footdrop Restrictions Weight Bearing Restrictions: No   Pertinent Vitals/Pain No c/o pain. Only with movement. BP 113/57 sitting EOB. 110/60 HOB 45    ADL  Grooming: Minimal assistance Where Assessed - Grooming: Supported sitting Upper Body Bathing: Minimal assistance Where Assessed - Upper Body Bathing: Supported sitting Lower Body Bathing: Maximal assistance Where Assessed - Lower Body Bathing: Supine, head of bed up;Rolling right and/or left Upper Body Dressing: Moderate assistance Where Assessed - Upper Body Dressing: Supported sitting Lower Body Dressing: +1 Total assistance Where Assessed - Lower Body Dressing: Supine, head of bed up;Lean right and/or left Toilet Transfer: Other (comment) (NA) Transfers/Ambulation Related to ADLs: not assessed due to dizziness ADL Comments: reviewed back precautions with pt/husband. Pt does not demonstrate learning at this time.?  due to meds?. will return this pm     OT Diagnosis: Generalized weakness;Acute pain  OT Problem List: Decreased strength;Decreased range of motion;Decreased activity tolerance;Impaired balance (sitting and/or standing);Decreased safety awareness;Decreased knowledge of use of DME or AE;Decreased knowledge of precautions;Pain OT Treatment Interventions: Self-care/ADL training;Energy conservation;Therapeutic activities;Patient/family education;Balance training   OT Goals Acute Rehab OT Goals OT Goal Formulation: With patient Time For Goal Achievement: 10/05/12 Potential to Achieve Goals: Good ADL Goals Pt Will Perform Lower Body Bathing: with min assist;with caregiver independent in assisting;Unsupported;with adaptive equipment ADL Goal: Lower Body Bathing - Progress: Goal set today Pt Will Perform Lower Body Dressing: with min assist;with caregiver independent in assisting;Unsupported;with adaptive equipment ADL Goal: Lower Body Dressing - Progress: Goal set today Pt Will Transfer to Toilet: with min assist;with caregiver independent in assisting;Ambulation;3-in-1;Maintaining back safety precautions ADL Goal: Toilet Transfer - Progress: Goal set today Pt Will Perform Toileting - Clothing Manipulation: with supervision;with caregiver independent in assisting;Standing;Sitting on 3-in-1 or toilet;with adaptive equipment ADL Goal: Toileting - Clothing Manipulation - Progress: Goal set today Pt Will Perform Toileting - Hygiene: with supervision;with caregiver independent in assisting;with adaptive equipment ADL Goal: Toileting - Hygiene - Progress: Goal set today Pt Will Perform Tub/Shower Transfer: with min assist;with caregiver independent in assisting;Ambulation;with DME;Maintaining back safety precautions ADL Goal: Tub/Shower Transfer - Progress: Goal set today Additional ADL Goal #1: Pt/family will independetly verbalize back precautions ADL Goal: Additional Goal #1 - Progress: Goal set  today  Visit Information  Last OT Received On: 09/21/12 Assistance Needed: +2 PT/OT Co-Evaluation/Treatment: Yes    Subjective Data      Prior Functioning     Home Living Lives With: Spouse Available Help at  Discharge: Family;Available 24 hours/day Type of Home: House Home Access: Stairs to enter [employer] of Steps: 1 Entrance Stairs-Rails: None Home Layout: One level Bathroom Shower/Tub: Forensic scientist: Handicapped height Bathroom Accessibility: Yes How Accessible: Accessible via walker Home Adaptive Equipment: Walker - rolling;Bedside commode/3-in-1 Prior Function Level of Independence: Independent Able to Take Stairs?: Yes Driving: Yes Vocation: Retired Musician: No difficulties Dominant Hand: Right         Vision/Perception  glasses   Cognition  Overall Cognitive Status: Appears within functional limits for tasks assessed/performed Arousal/Alertness: Lethargic Orientation Level: Appears intact for tasks assessed Behavior During Session: Lethargic Cognition - Other Comments: most likely from meds    Extremity/Trunk Assessment Right Upper Extremity Assessment RUE ROM/Strength/Tone: WFL for tasks assessed RUE Sensation: WFL - Light Touch;WFL - Proprioception RUE Coordination: WFL - gross/fine motor Left Upper Extremity Assessment LUE ROM/Strength/Tone: WFL for tasks assessed LUE Sensation: WFL - Light Touch;WFL - Proprioception LUE Coordination: WFL - gross/fine motor Right Lower Extremity Assessment RLE ROM/Strength/Tone: WFL for tasks assessed RLE Sensation: Deficits (reoprts decreased sensation prior to surgery) Left Lower Extremity Assessment LLE ROM/Strength/Tone: Deficits LLE ROM/Strength/Tone Deficits: Prior footdrop. reports increased weakness propr to surgery LLE Sensation: Deficits LLE Sensation Deficits: reports incrased deficits with sensation prior to surgery LLE Coordination:  Deficits Trunk Assessment Trunk Assessment: Other exceptions (L lat bias)     Mobility Bed Mobility Bed Mobility: Rolling Right;Right Sidelying to Sit;Sitting - Scoot to Edge of Bed Rolling Right: 3: Mod assist;With rail Right Sidelying to Sit: 2: Max assist;HOB flat;With rails Sitting - Scoot to Edge of Bed: 3: Mod assist Details for Bed Mobility Assistance: Max vc and tactile cues with manual facilitation Transfers Transfers: Not assessed (due to c/o dizziness. low BP)     Shoulder Instructions     Exercise     Balance Balance Balance Assessed: Yes Static Sitting Balance Static Sitting - Balance Support: Bilateral upper extremity supported;Feet supported Static Sitting - Level of Assistance: 4: Min assist Static Sitting - Comment/# of Minutes: 10 (L lat bias)   End of Session OT - End of Session Activity Tolerance: Treatment limited secondary to medical complications (Comment) (diiziness and low BP) Patient left: in bed;with call bell/phone within reach;with family/visitor present;Other (comment) (HOB elevated to 45) Nurse Communication: Mobility status;Other (comment) (medical status)  GO     Colbin Jovel,HILLARYHilary Swayze Kozuch, OTR/L  [phone] 09/21/2012 09/21/2012, 12:11 PM

## 2012-09-21 NOTE — Progress Notes (Signed)
Subjective: Patient reports Feels a bit better today. Is tolerating oral diet. Which is to be mobilized more.  Objective: Vital signs in last 24 hours: Temp:  [97.9 F (36.6 C)-98.9 F (37.2 C)] 97.9 F (36.6 C) (02/02 0550) Pulse Rate:  [86-109] 105  (02/02 0550) Resp:  [16-20] 18  (02/02 0550) BP: (76-110)/(46-66) 110/51 mmHg (02/02 0550) SpO2:  [91 %-100 %] 93 % (02/02 0550)  Intake/Output from previous day: 02/01 0701 - 02/02 0700 In: -  Out: 925 [Urine:550; Drains:375] Intake/Output this shift: Total I/O In: 240 [P.O.:240] Out: -   Incision is clean and dry. Motor function is good save for footdrop. Patient has AFO splint to mobilize with.  Lab Results: No results found for this basename: WBC:2,HGB:2,HCT:2,PLT:2 in the last 72 hours BMET No results found for this basename: NA:2,K:2,CL:2,CO2:2,GLUCOSE:2,BUN:2,CREATININE:2,CALCIUM:2 in the last 72 hours  Studies/Results: Dg Lumbar Spine 2-3 Views  09/19/2012  *RADIOLOGY REPORT*  Clinical Data: Back pain  DG C-ARM 1-60 MIN,LUMBAR SPINE - 2-3 VIEW  Comparison: None.  Findings: Transitional anatomy is present. The history reports L2- S1 fusion.  Pedicle screws are assumed to be in the L2-S1 vertebral bodies on these AP lateral C-arm films.  Interbody cages are seen at L3-4, L4-5, and L5-S1.  The last disc space which is fused is present at the S1-2.  IMPRESSION: As above peri   Original Report Authenticated By: Davonna Belling, M.D.    Dg Lumbar Spine 1 View  09/19/2012  *RADIOLOGY REPORT*  Clinical Data: Back pain  LUMBAR SPINE - 1 VIEW  Comparison: None.  Findings: Radiograph was taken at 1115 hours.  No prior films are available for comparison.  The L5-S1 level is assumed to be fused. Probes are seen dorsal to L1-2, L2-3, L3-4, L4-5, and L5-S1.  IMPRESSION: Correlate with cross-sectional imaging regarding appropriate placement of instruments.  I do not have cross-sectional imaging available.  The L5-S1 level appears fused and the  last open interspace appears to be L4-5.  This radiograph is numbered in accordance with those assumptions.   Original Report Authenticated By: Davonna Belling, M.D.    Dg C-arm 1-60 Min  09/19/2012  *RADIOLOGY REPORT*  Clinical Data: Back pain  DG C-ARM 1-60 MIN,LUMBAR SPINE - 2-3 VIEW  Comparison: None.  Findings: Transitional anatomy is present. The history reports L2- S1 fusion.  Pedicle screws are assumed to be in the L2-S1 vertebral bodies on these AP lateral C-arm films.  Interbody cages are seen at L3-4, L4-5, and L5-S1.  The last disc space which is fused is present at the S1-2.  IMPRESSION: As above peri   Original Report Authenticated By: Davonna Belling, M.D.     Assessment/Plan: Stable postop day 2.  LOS: 2 days  Mobilize as tolerated, hep well IV fluid.   Danielle Blair J 09/21/2012, 8:56 AM

## 2012-09-21 NOTE — Progress Notes (Addendum)
Occupational Therapy Treatment Patient Details Name: Danielle Blair MRN: [id number] DOB: 16-Aug-1941 Today's Date: 09/21/2012 Time: 4098-1191 OT Time Calculation (min): 25 min  OT Assessment / Plan / Recommendation Comments on Treatment Session Able to get OOB to chair this pm after HOB elevated this am. After pm session, apparent that pt will need SNF for rehab prior to return home. Pt/husband would like to see if pt could go to [facility] ([facility] in [place] for rehab. Pt will benefit from skilled Ot serivces to max independece with ADL and functional moiblity for ADL to facilitate D/C to SNF.    Follow Up Recommendations  SNF    Barriers to Discharge   none    Equipment Recommendations  Tub/shower bench    Recommendations for Other Services  SW for SNF  Frequency Min 2X/week   Plan Discharge plan needs to be updated    Precautions / Restrictions Precautions Precautions: Back;Fall Precaution Booklet Issued: Yes (comment) Precaution Comments: AFO - previous footdrop Restrictions Weight Bearing Restrictions: No   Pertinent Vitals/Pain No c/o pain    ADL       OT Diagnosis:    OT Problem List:   OT Treatment Interventions:     OT Goals Acute Rehab OT Goals OT Goal Formulation: With patient Time For Goal Achievement: 10/05/12 Potential to Achieve Goals: Good ADL Goals Pt Will Perform Lower Body Bathing: with min assist;with caregiver independent in assisting;Unsupported;with adaptive equipment Pt Will Perform Lower Body Dressing: with min assist;with caregiver independent in assisting;Unsupported;with adaptive equipment Pt Will Transfer to Toilet: with min assist;with caregiver independent in assisting;Ambulation;3-in-1;Maintaining back safety precautions ADL Goal: Toilet Transfer - Progress: Progressing toward goals Pt Will Perform Toileting - Clothing Manipulation: with supervision;with caregiver independent in assisting;Standing;Sitting on  3-in-1 or toilet;with adaptive equipment Pt Will Perform Toileting - Hygiene: with supervision;with caregiver independent in assisting;with adaptive equipment Pt Will Perform Tub/Shower Transfer: with min assist;with caregiver independent in assisting;Ambulation;with DME;Maintaining back safety precautions ADL Goal: Tub/Shower Transfer - Progress: Progressing toward goals Additional ADL Goal #1: Pt/family will independetly verbalize back precautions ADL Goal: Additional Goal #1 - Progress: Progressing toward goals  Visit Information  Last OT Received On: 09/21/12 Assistance Needed: +2 PT/OT Co-Evaluation/Treatment: Yes    Subjective Data      Prior Functioning       Cognition  Overall Cognitive Status: Impaired Area of Impairment: Memory;Following commands;Safety/judgement;Awareness of errors;Problem solving Arousal/Alertness: Lethargic Orientation Level: Appears intact for tasks assessed Behavior During Session: Lethargic Memory: Decreased recall of precautions Memory Deficits: recalled 1/3 precautions from earlier session Following Commands: Follows multi-step commands inconsistently Safety/Judgement: Decreased awareness of safety precautions;Decreased safety judgement for tasks assessed Safety/Judgement - Other Comments: decrased awareness of need to follow back precautions Awareness of Errors: Assistance required to identify errors made;Assistance required to correct errors made Awareness of Errors - Other Comments: Pt needing manual cues to assist Problem Solving: mod a functional basic Cognition - Other Comments: most likely due to medicaiton. husband states this is not her cognitive baseline    Mobility  Shoulder Instructions Bed Mobility Bed Mobility: Rolling Left;Left Sidelying to Sit;Sitting - Scoot to Edge of Bed Rolling Right: 3: Mod assist;With rail Rolling Left: 3: Mod assist Right Sidelying to Sit: 2: Max assist;HOB flat;With rails Left Sidelying to Sit: 2: Max  assist;HOB flat;With rails Sitting - Scoot to Edge of Bed: 4: Min assist;With rail Details for Bed Mobility Assistance: Max vc and tactile cues with manual facilitation Transfers Transfers: Sit  to Stand;Stand to Sit Sit to Stand: 1: +2 Total assist;From elevated surface;With upper extremity assist;From bed Sit to Stand: Patient Percentage: 70% Stand to Sit: 1: +2 Total assist;With upper extremity assist;To chair/3-in-1 Stand to Sit: Patient Percentage: 70% Details for Transfer Assistance: L knee buckling, with/without AFO, poor quad control       Exercises  General Exercises - Lower Extremity Ankle Circles/Pumps: AROM;Other reps (comment) (continuous to assist in circulation to for low BP) Quad Sets: AROM;10 reps;Standing   Balance Balance Balance Assessed: Yes Static Sitting Balance Static Sitting - Balance Support: Bilateral upper extremity supported Static Sitting - Level of Assistance: 4: Min assist Static Sitting - Comment/# of Minutes: 5 Static Standing Balance Static Standing - Balance Support: Bilateral upper extremity supported Static Standing - Level of Assistance: 3: Mod assist Static Standing - Comment/# of Minutes: 10. L bias Dynamic Standing Balance Dynamic Standing - Balance Support: Bilateral upper extremity supported Dynamic Standing - Level of Assistance: 1: +2 Total assist;Patient percentage (comment) Dynamic Standing - Balance Activities: Other (comment) (transfers) Dynamic Standing - Comments: Pt @ 60%. Leaning L. difficulty supoting self on LLE   End of Session OT - End of Session Equipment Utilized During Treatment: Gait belt Activity Tolerance: Patient tolerated treatment well Patient left: in chair;with call bell/phone within reach Nurse Communication: Mobility status;Other (comment);Need for lift equipment (concerns over cognitive status)  GO     Zoila Ditullio,HILLARY 09/21/2012, 3:19 PM [facility], OTR/L  [phone] 09/21/2012

## 2012-09-21 NOTE — Progress Notes (Signed)
Physical Therapy Treatment Patient Details Name: Danielle Blair MRN: [id number] DOB: [birth year] Today's Date: 09/21/2012 Time: 4098-1191 PT Time Calculation (min): 25 min  PT Assessment / Plan / Recommendation Comments on Treatment Session  Pt presents with confusion during ambulation and maximal deficits in mobility. Pt demonstrates very minimal Quad control LLE with multiple episodes of knee buckling during ambulation. Patient with difficulty processing and requires assist for use of rw despite repetitive VCs for sequencing and muscle setting. May need to consider inpatient rehab prior to d.c home pending progress at pt is not safe with activity.    Follow Up Recommendations  SNF                 Frequency Min 5X/week   Plan Discharge plan needs to be updated    Precautions / Restrictions Precautions Precautions: Back;Fall Precaution Booklet Issued: Yes (comment) Precaution Comments: AFO - previous footdrop Restrictions Weight Bearing Restrictions: No   Pertinent Vitals/Pain 4/10    Mobility  Bed Mobility Bed Mobility: Rolling Right;Right Sidelying to Sit;Sitting - Scoot to Edge of Bed Rolling Right: 3: Mod assist;With rail Right Sidelying to Sit: 2: Max assist;HOB flat;With rails Sitting - Scoot to Edge of Bed: 3: Mod assist Details for Bed Mobility Assistance: Max vc and tactile cues with manual facilitation Transfers Transfers: Sit to Stand;Stand to Sit Ambulation/Gait Ambulation/Gait Assistance: 1: +2 Total assist Ambulation/Gait: Patient Percentage: 70% Ambulation Distance (Feet): 26 Feet (Addtl. 8 feet during 2nd trial) Assistive device: Rolling walker Ambulation/Gait Assistance Details: Used AFO; Pt demonstrates NO LLE Quad control, buckling with each step, Pt with difficulty following commands for proper use of rw and gait sequencing despite max VCs and occassional manual cues and assist. Pt not steady for ambulation. Gait Pattern: Step-to pattern;Left foot  flat;Decreased weight shift to left;Decreased hip/knee flexion - left;Decreased stride length;Lateral hip instability;Narrow base of support (wears AFO) Gait velocity: decreased General Gait Details: Pt with severe buckling and exerting maximal effort during gait Stairs: No    Exercises General Exercises - Lower Extremity Ankle Circles/Pumps: AROM;Other reps (comment) (continuous to assist in circulation to for low BP) Quad Sets: AROM;10 reps;Standing   PT Diagnosis: Difficulty walking;Abnormality of gait;Generalized weakness;Acute pain  PT Problem List: Decreased strength;Decreased range of motion;Decreased activity tolerance;Decreased balance;Decreased mobility;Decreased coordination;Pain PT Treatment Interventions: DME instruction;Gait training;Stair training;Functional mobility training;Therapeutic activities;Therapeutic exercise;Patient/family education   PT Goals Acute Rehab PT Goals PT Goal Formulation: With patient Time For Goal Achievement: 09/28/12 Potential to Achieve Goals: Good Pt will go Supine/Side to Sit: with modified independence PT Goal: Supine/Side to Sit - Progress: Goal set today Pt will go Sit to Supine/Side: with modified independence PT Goal: Sit to Supine/Side - Progress: Goal set today Pt will go Sit to Stand: with modified independence PT Goal: Sit to Stand - Progress: Goal set today Pt will go Stand to Sit: with modified independence PT Goal: Stand to Sit - Progress: Goal set today Pt will Ambulate: >150 feet;with modified independence PT Goal: Ambulate - Progress: Goal set today Pt will Go Up / Down Stairs: 3-5 stairs;with min assist PT Goal: Up/Down Stairs - Progress: Goal set today  Visit Information  Last PT Received On: 09/21/12 Assistance Needed: +2    Subjective Data  Subjective: Pt agreeable to therapy Patient Stated Goal: to go home with spouse   Cognition  Overall Cognitive Status: Impaired Area of Impairment: Memory;Following  commands;Safety/judgement;Awareness of errors;Problem solving Arousal/Alertness: Lethargic Orientation Level: Appears intact for tasks assessed Behavior During Session: Lethargic Memory:  Decreased recall of precautions Following Commands: Follows multi-step commands inconsistently Safety/Judgement: Decreased awareness of safety precautions;Decreased safety judgement for tasks assessed Awareness of Errors: Assistance required to identify errors made;Assistance required to correct errors made Awareness of Errors - Other Comments: Pt needing manual cues to assist Cognition - Other Comments: most likely from meds    Balance  Balance Balance Assessed: Yes Static Sitting Balance Static Sitting - Balance Support: Bilateral upper extremity supported;Feet supported Static Sitting - Level of Assistance: 4: Min assist Static Sitting - Comment/# of Minutes: 10 (L lat bias)  End of Session PT - End of Session Equipment Utilized During Treatment: Gait belt;Left ankle foot orthosis Activity Tolerance: Patient limited by fatigue;Treatment limited secondary to medical complications (Comment) Patient left: in chair;with call bell/phone within reach;with family/visitor present (HOB elevated) Nurse Communication: Mobility status (Pt in chair with instructions for OOB 1 hour)   GP     Fabio Asa 09/21/2012, 2:59 PM Charlotte Crumb, PT DPT  [phone]

## 2012-09-21 NOTE — Evaluation (Signed)
Physical Therapy Evaluation Patient Details Name: Danielle Blair MRN: [id number] DOB: Jan 05, 1941 Today's Date: 09/21/2012 Time: 4098-1191 PT Time Calculation (min): 28 min  PT Assessment / Plan / Recommendation Clinical Impression  Pt is a 72 y.o female s/p lumbar sx. Pt presents with deficits in functional mobility secondary to decreased activity tolerance, pain, and fatigue. Will continue to see acutely to address deficits and imporve overall function. Pt c/o dizziness which did not resolve with increased time sitting EOB. BP 113/57. Pt reclined and HOB increased to 45. Will return after lunch to get pt OOB.      PT Assessment  Patient needs continued PT services    Follow Up Recommendations   (Will perform further assessment next session)                Frequency Min 5X/week    Precautions / Restrictions Precautions Precautions: Back;Fall Precaution Booklet Issued: Yes (comment) Precaution Comments: AFO - previous footdrop Restrictions Weight Bearing Restrictions: No   Pertinent Vitals/Pain 6/10      Mobility  Bed Mobility Bed Mobility: Rolling Right;Right Sidelying to Sit;Sitting - Scoot to Edge of Bed Rolling Right: 3: Mod assist;With rail Right Sidelying to Sit: 2: Max assist;HOB flat;With rails Sitting - Scoot to Edge of Bed: 3: Mod assist Details for Bed Mobility Assistance: Max vc and tactile cues with manual facilitation Transfers Transfers: Not assessed Ambulation/Gait Ambulation/Gait Assistance: Not tested (comment)       Exercises General Exercises - Lower Extremity Ankle Circles/Pumps: AROM;Other reps (comment) (continuous to assist in circulation to for low BP)   PT Diagnosis: Difficulty walking;Abnormality of gait;Generalized weakness;Acute pain  PT Problem List: Decreased strength;Decreased range of motion;Decreased activity tolerance;Decreased balance;Decreased mobility;Decreased coordination;Pain PT Treatment Interventions: DME instruction;Gait  training;Stair training;Functional mobility training;Therapeutic activities;Therapeutic exercise;Patient/family education   PT Goals Acute Rehab PT Goals PT Goal Formulation: With patient Time For Goal Achievement: 09/28/12 Potential to Achieve Goals: Good Pt will go Supine/Side to Sit: with modified independence PT Goal: Supine/Side to Sit - Progress: Goal set today Pt will go Sit to Supine/Side: with modified independence PT Goal: Sit to Supine/Side - Progress: Goal set today Pt will go Sit to Stand: with modified independence PT Goal: Sit to Stand - Progress: Goal set today Pt will go Stand to Sit: with modified independence PT Goal: Stand to Sit - Progress: Goal set today Pt will Ambulate: >150 feet;with modified independence PT Goal: Ambulate - Progress: Goal set today Pt will Go Up / Down Stairs: 3-5 stairs;with min assist PT Goal: Up/Down Stairs - Progress: Goal set today  Visit Information  Last PT Received On: 09/21/12 Assistance Needed: +2    Subjective Data  Subjective: I am very sore, and haven't been out of bed yet Patient Stated Goal: to go home with spouse   Prior Functioning  Home Living Lives With: Spouse Available Help at Discharge: Family;Available 24 hours/day Type of Home: House Home Access: Stairs to enter [employer] of Steps: 1 Entrance Stairs-Rails: None Home Layout: One level Bathroom Shower/Tub: Forensic scientist: Handicapped height Bathroom Accessibility: Yes How Accessible: Accessible via walker Home Adaptive Equipment: Walker - rolling;Bedside commode/3-in-1 Prior Function Level of Independence: Independent Able to Take Stairs?: Yes Driving: Yes Vocation: Retired Musician: No difficulties Dominant Hand: Right    Cognition  Overall Cognitive Status: Appears within functional limits for tasks assessed/performed Arousal/Alertness: Lethargic Orientation Level: Appears intact for tasks  assessed Behavior During Session: Lethargic Cognition - Other Comments: most likely from meds  Extremity/Trunk Assessment Right Upper Extremity Assessment RUE ROM/Strength/Tone: WFL for tasks assessed RUE Sensation: WFL - Light Touch;WFL - Proprioception RUE Coordination: WFL - gross/fine motor Left Upper Extremity Assessment LUE ROM/Strength/Tone: WFL for tasks assessed LUE Sensation: WFL - Light Touch;WFL - Proprioception LUE Coordination: WFL - gross/fine motor Right Lower Extremity Assessment RLE ROM/Strength/Tone: WFL for tasks assessed RLE Sensation: Deficits (reoprts decreased sensation prior to surgery) Left Lower Extremity Assessment LLE ROM/Strength/Tone: Deficits LLE ROM/Strength/Tone Deficits: Prior footdrop. reports increased weakness propr to surgery LLE Sensation: Deficits LLE Sensation Deficits: reports incrased deficits with sensation prior to surgery LLE Coordination: Deficits Trunk Assessment Trunk Assessment: Other exceptions (L lat bias)   Balance Balance Balance Assessed: Yes Static Sitting Balance Static Sitting - Balance Support: Bilateral upper extremity supported;Feet supported Static Sitting - Level of Assistance: 4: Min assist Static Sitting - Comment/# of Minutes: 10 (L lat bias)  End of Session PT - End of Session Activity Tolerance: Patient limited by fatigue;Treatment limited secondary to medical complications (Comment) (low BP with dizziness) Patient left: in bed;with call bell/phone within reach;with family/visitor present (HOB elevated) Nurse Communication: Mobility status (low BP )  GP     Fabio Asa 09/21/2012, 2:43 PM  Charlotte Crumb, PT DPT  [phone]

## 2012-09-22 ENCOUNTER — Encounter (HOSPITAL_COMMUNITY): Payer: Self-pay | Admitting: Neurosurgery

## 2012-09-22 LAB — GLUCOSE, CAPILLARY
Glucose-Capillary: 114 mg/dL — ABNORMAL HIGH (ref 70–99)
Glucose-Capillary: 83 mg/dL (ref 70–99)

## 2012-09-22 NOTE — Progress Notes (Signed)
UR COMPLETED  

## 2012-09-22 NOTE — Progress Notes (Signed)
Physical Therapy Treatment Patient Details Name: Danielle Blair MRN: [id number] DOB: 11/22/1940 Today's Date: 09/22/2012 Time: 4098-1191 PT Time Calculation (min): 38 min  PT Assessment / Plan / Recommendation Comments on Treatment Session  Pt with significant improvement both cognitively and from mobility stand point. Pt remains to require some assist with all transfers and ambulation. Pt con't to have have onset of fatigue and pain limiting activity tolerance. Pt con't to benefit from ST-SNF to achieve mod I mobility to decrease burden on spouse.    Follow Up Recommendations  SNF;Supervision/Assistance - 24 hour     Does the patient have the potential to tolerate intense rehabilitation     Barriers to Discharge        Equipment Recommendations  None recommended by PT    Recommendations for Other Services    Frequency Min 5X/week   Plan Discharge plan remains appropriate;Frequency remains appropriate    Precautions / Restrictions Precautions Precautions: Back;Fall Precaution Booklet Issued: Yes (comment) Precaution Comments: pt able to recall 3/3 precautions Required Braces or Orthoses: Spinal Brace (AFO- previous foot drop) Spinal Brace: Lumbar corset Restrictions Weight Bearing Restrictions: No   Pertinent Vitals/Pain 6/10 surgical back pain    Mobility  Bed Mobility Bed Mobility: Rolling Left;Left Sidelying to Sit;Sitting - Scoot to [place] of Bed Rolling Left: 4: Min assist Left Sidelying to Sit: 4: Min assist;HOB flat Sitting - Scoot to [place] of Bed: 5: Supervision Details for Bed Mobility Assistance: directional v/c's for techniqe to mimic home without rail, increased time but able to complete task Transfers Transfers: Sit to Stand;Stand to Sit Sit to Stand: 4: Min assist;From bed;With upper extremity assist Stand to Sit: 4: Min guard;With upper extremity assist;To chair/3-in-1 Details for Transfer Assistance: increaed time, guarded, v/c's for hand placement, no L  knee buckling Ambulation/Gait Ambulation/Gait Assistance: 4: Min assist Ambulation Distance (Feet): 50 Feet Assistive device: Rolling walker Ambulation/Gait Assistance Details: pt used L AFO due to previous foot drop. no episodes of L knee buckling. pt with drifting to L side. v/c's for walker management. pt unable to con't walk with step-through gait pattern Gait Pattern: Step-to pattern;Decreased stride length;Decreased hip/knee flexion - left;Decreased weight shift to left;Decreased stance time - left;Decreased step length - right;Narrow base of support Gait velocity: decreased General Gait Details: pt with onset of back pain limiting amb tolerance Stairs: Yes Stairs Assistance: 4: Min assist Stairs Assistance Details (indicate cue type and reason): completed both platform step and 8 in step to mimic home entrance.max directional v/c's, spouse present. v/c's to go up with R LE and down with L LE, spouse also with verbal understanding. no episodes of L Knee buckling Stair Management Technique: Two rails;With walker Number of Stairs: 2     Exercises     PT Diagnosis:    PT Problem List:   PT Treatment Interventions:     PT Goals Acute Rehab PT Goals PT Goal: Supine/Side to Sit - Progress: Progressing toward goal PT Goal: Sit to Supine/Side - Progress: Progressing toward goal PT Goal: Sit to Stand - Progress: Progressing toward goal PT Goal: Stand to Sit - Progress: Progressing toward goal PT Goal: Ambulate - Progress: Progressing toward goal PT Goal: Up/Down Stairs - Progress: Progressing toward goal  Visit Information  Last PT Received On: 09/22/12 Assistance Needed: +1    Subjective Data  Subjective: Pt received supine in bed, agreeable to PT.   Cognition  Cognition Overall Cognitive Status: Appears within functional limits for tasks assessed/performed Orientation Level: Appears  intact for tasks assessed Behavior During Session: [facility] for tasks performed Cognition - Other  Comments: pt with improved cognitition compared to PT eval yesterday    Balance  Static Sitting Balance Static Sitting - Balance Support: No upper extremity supported Static Sitting - Level of Assistance: 5: Stand by assistance Static Sitting - Comment/# of Minutes: 2  End of Session PT - End of Session Equipment Utilized During Treatment: Gait belt;Left ankle foot orthosis;Back brace Activity Tolerance: Patient tolerated treatment well Patient left: in chair;with call bell/phone within reach;with family/visitor present Nurse Communication: Mobility status (left msg with CSW re: SNF)   GP     Marcene Brawn 09/22/2012, 11:50 AM   Lewis Shock, PT, DPT Pager #: [phone] Office #: [phone]

## 2012-09-22 NOTE — Clinical Social Work Placement (Addendum)
    Clinical Social Work Department CLINICAL SOCIAL WORK PLACEMENT NOTE 09/22/2012  Patient:  Danielle Blair, Danielle Blair  Account Number:  [id number] Admit date:  09/19/2012  Clinical Social Worker:  Peggyann Shoals  Date/time:  09/22/2012 03:40 PM  Clinical Social Work is seeking post-discharge placement for this patient at the following level of care:   SKILLED NURSING   (*CSW will update this form in Epic as items are completed)   09/22/2012  Patient/family provided with [facility] Department of Clinical Social Work's list of facilities offering this level of care within the geographic area requested by the patient (or if unable, by the patient's family).  09/22/2012  Patient/family informed of their freedom to choose among providers that offer the needed level of care, that participate in Medicare, Medicaid or managed care program needed by the patient, have an available bed and are willing to accept the patient.  09/22/2012  Patient/family informed of [facility]' ownership interest in [facility], as well as of the fact that they are under no obligation to receive care at this facility.  PASARR submitted to EDS on n/a PASARR number received from EDS on   FL2 transmitted to all facilities in geographic area requested by pt/family on  09/22/2012 FL2 transmitted to all facilities within larger geographic area on   Patient informed that his/her managed care company has contracts with or will negotiate with  certain facilities, including the following:     Patient/family informed of bed offers received:  09/23/2012 Patient chooses bed at [facility] Physician recommends and patient chooses bed at  SNF  Patient to be transferred to [facility] on 09/23/2012 Patient to be transferred to facility by pt's husband.   The following physician request were entered in Epic:   Additional Comments: Pt is discharging to SNF in [place], therefore PASARR# in not required.

## 2012-09-22 NOTE — Progress Notes (Signed)
Subjective: Patient reports doing well  Objective: Vital signs in last 24 hours: Temp:  [97.2 F (36.2 C)-98.8 F (37.1 C)] 97.7 F (36.5 C) (02/03 0611) Pulse Rate:  [90-104] 90  (02/03 0611) Resp:  [18-20] 20  (02/03 0611) BP: (83-115)/(46-62) 102/55 mmHg (02/03 0611) SpO2:  [92 %-96 %] 95 % (02/03 0611)  Intake/Output from previous day: 02/02 0701 - 02/03 0700 In: 1000 [P.O.:1000] Out: 6354 [Urine:6200; Drains:152; Stool:2] Intake/Output this shift:    Physical Exam: Persistent left foot drop.  No headache. Dressing CDI  Lab Results: No results found for this basename: WBC:2,HGB:2,HCT:2,PLT:2 in the last 72 hours BMET No results found for this basename: NA:2,K:2,CL:2,CO2:2,GLUCOSE:2,BUN:2,CREATININE:2,CALCIUM:2 in the last 72 hours  Studies/Results: No results found.  Assessment/Plan: Mobilizing with PT.  To work on bowels.  Doing well.    LOS: 3 days    Dorian Heckle, MD 09/22/2012, 8:31 AM

## 2012-09-22 NOTE — Clinical Social Work Psychosocial (Signed)
     Clinical Social Work Department BRIEF PSYCHOSOCIAL ASSESSMENT 09/22/2012  Patient:  Danielle Blair, Danielle Blair     Account Number:  [id number]     Admit date:  09/19/2012  Clinical Social Worker:  Peggyann Shoals  Date/Time:  09/22/2012 03:34 PM  Referred by:  Physician  Date Referred:  09/22/2012 Referred for  SNF Placement   Other Referral:   Interview type:  Family Other interview type:    PSYCHOSOCIAL DATA Living Status:  HUSBAND Admitted from facility:   Level of care:   Primary support name:  Danielle Blair Primary support relationship to patient:  SPOUSE Degree of support available:   Supportive.    CURRENT CONCERNS Current Concerns  Post-Acute Placement   Other Concerns:    SOCIAL WORK ASSESSMENT / PLAN CSW met with pt and family to address consult. CSW introduced herself and explained role of social work. CSW also explained the process of discharging to SNF.    Pt lives in [place] with her husband. PT is recommending SNF placement. Pt is interested in [facility].    CSW will contact [facility]. CSW will initiate referral for facilities in [place]. CSW will follow up with bed offers. CSW will continue to follow.   Assessment/plan status:  Psychosocial Support/Ongoing Assessment of Needs Other assessment/ plan:   Information/referral to community resources:   SNF list    PATIENTS/FAMILYS RESPONSE TO PLAN OF CARE: Pt and husband were very pleasant. Pt is agreeable to SNF placement at discharge.

## 2012-09-22 NOTE — Plan of Care (Signed)
Problem: Phase I Progression Outcomes Goal: Initial discharge plan identified Outcome: Progressing Husband checking resources for rehab in [place] prior to discharge.

## 2012-09-23 LAB — GLUCOSE, CAPILLARY: Glucose-Capillary: 131 mg/dL — ABNORMAL HIGH (ref 70–99)

## 2012-09-23 NOTE — Progress Notes (Signed)
Transferring to Rehab today.

## 2012-09-23 NOTE — Progress Notes (Signed)
Subjective: Patient reports I feel really good. I think I'm going to [facility] today."  Objective: Vital signs in last 24 hours: Temp:  [97.6 F (36.4 C)-98.6 F (37 C)] 98.6 F (37 C) (02/04 1020) Pulse Rate:  [90-100] 90  (02/04 1020) Resp:  [19-20] 19  (02/04 1020) BP: (91-133)/(44-67) 91/67 mmHg (02/04 1020) SpO2:  [95 %-100 %] 95 % (02/04 1020)  Intake/Output from previous day: 02/03 0701 - 02/04 0700 In: 600 [P.O.:600] Out: -  Intake/Output this shift: Total I/O In: 360 [P.O.:360] Out: -   Alert, conversant. Reports pain only with position changes - no pain supine, sitting, or walking. Drsg intact, dry. Suture intact. Incision without erythema, swelling, drainage. Good strength BLE.   Lab Results: No results found for this basename: WBC:2,HGB:2,HCT:2,PLT:2 in the last 72 hours BMET No results found for this basename: NA:2,K:2,CL:2,CO2:2,GLUCOSE:2,BUN:2,CREATININE:2,CALCIUM:2 in the last 72 hours  Studies/Results: No results found.  Assessment/Plan: Improving  LOS: 4 days  Per Dr. Venetia Maxon, d/c IV; d/c to SNF. Rx's Oxycodone 10mg  1po q4hrs prn pain; Valium 5mg  1 po q8hrs prn spasm.   Georgiann Cocker 09/23/2012, 10:53 AM

## 2012-09-23 NOTE — Care Management Note (Signed)
    Page 1 of 1   09/23/2012     4:23:33 PM   CARE MANAGEMENT NOTE 09/23/2012  Patient:  Danielle Blair, Danielle Blair   Account Number:  [id number]  Date Initiated:  09/22/2012  Documentation initiated by:  [facility]  Subjective/Objective Assessment:   admitted postop TLIF 4 level     Action/Plan:   PT/OT evals- recommended SNF   Anticipated DC Date:  09/25/2012   Anticipated DC Plan:  SKILLED NURSING FACILITY  In-house referral  Clinical Social Worker      DC Planning Services  CM consult      Choice offered to / List presented to:             Status of service:  Completed, signed off Medicare Important Message given?   (If response is "NO", the following Medicare IM given date fields will be blank) Date Medicare IM given:   Date Additional Medicare IM given:    Discharge Disposition:  SKILLED NURSING FACILITY  Per UR Regulation:  Reviewed for med. necessity/level of care/duration of stay  If discussed at Long Length of Stay Meetings, dates discussed:    Comments:

## 2012-09-23 NOTE — Discharge Summary (Signed)
Physician Discharge Summary  Patient ID: Danielle Blair MRN: [id number] DOB/AGE: 04/18/1941 72 y.o.  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    Principal Problem:  *Lumbar scoliosis L2-S!   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 She recovered nicely in Neuro PACU before transfer to floor. She has progressed steadily.     Consults: None  Significant Diagnostic Studies: radiology: X-Ray: intra-operative  Treatments: surgery: TRANSFORAMINAL LUMBAR INTERBODY FUSION (TLIF) WITH PEDICLE SCREW FIXATION 4 LEVEL (N/A) - Lumbar two to sacral one redo laminectomy with a decompression and transforaminal lumbar interbody fusion with segmental instrumentation L2-S1 bilaterally with posterolateral arthrodesis L2-S1 levels.   Discharge Exam: Blood pressure 91/67, pulse 90, temperature 98.6 F (37 C), temperature source Oral, resp. rate 19, height 5\' 4"  (1.626 m), weight 79.425 kg (175 lb 1.6 oz), SpO2 95.00%. Alert, conversant. Reports pain only with position changes - no pain supine, sitting, or walking. Drsg intact, dry. Suture intact. Incision without erythema, swelling, drainage. Good strength BLE.     Disposition: D/C to  SNF- [facility] in [place], [place]. Rx's Oxycodone 10mg  1po q4hrs prn pain; Valium 5mg  1 po q8hrs prn spasm. Pt verbalizes understanding of d/c instructions. Suture to be removed 2weeks post-op in Rehab or at office. Pt verbalizes understanding.       Medication List     As of 09/23/2012 10:57 AM    ASK your doctor about these medications         aspirin EC 81 MG tablet   Take 81 mg by mouth daily.      CALTRATE 600+D PO   Take 1 tablet by mouth 2 (two) times daily.      cetirizine 10 MG tablet   Commonly known as: ZYRTEC   Take 10 mg by mouth daily.      fenofibrate 145 MG tablet   Commonly known as: TRICOR   Take 145 mg by mouth daily.      fish oil-omega-3 fatty acids 1000 MG capsule   Take 2 g by mouth daily.      glipiZIDE 2.5 MG 24 hr tablet   Commonly known as: GLUCOTROL XL   Take 2.5 mg by mouth daily.      levothyroxine 125 MCG tablet   Commonly known as: SYNTHROID, LEVOTHROID   Take 125 mcg by mouth daily.      lisinopril 2.5 MG tablet   Commonly known as: PRINIVIL,ZESTRIL   Take 2.5 mg by mouth daily.      metFORMIN 1000 MG tablet   Commonly known as: GLUCOPHAGE   Take 1,000 mg by mouth 2 (two) times daily with a meal.      oxyCODONE-acetaminophen 5-325 MG per tablet   Commonly known as: PERCOCET/ROXICET   Take 1 tablet by mouth every 4 (four) hours as needed. For pain      vitamin B-12 1000 MCG tablet   Commonly known as: CYANOCOBALAMIN  Take 1,000 mcg by mouth daily.      Vitamin D-3 1000 UNITS Caps   Take 2 capsules by mouth daily.         Signed: Georgiann Cocker 09/23/2012, 10:57 AM

## 2012-09-23 NOTE — Progress Notes (Signed)
1550 Report called to nurse Maryn,LPN at [facility] in [place] patient transported by husband in car.Patient is alert and oriented. Dsg clean and dry to back . Skin WNL left foot with footdrop. Left floor in wheelchair accompanied by staff and husband.

## 2012-09-23 NOTE — Clinical Social Work Note (Signed)
Clinical Social Work   Pt is ready for discharge to [facility] in [place], [place]. Facility has received discharge summary and is ready to accept pt. Pt and husband are agreeable to discharge plan. Pt's husband will provide transportation to facility. CSW is signing off as no further needs identified.   Dede Query, MSW, [facility] Majors [phone]

## 2012-09-23 NOTE — Progress Notes (Signed)
Physical Therapy Treatment Patient Details Name: Danielle Blair MRN: [id number] DOB: 09/17/1940 Today's Date: 09/23/2012 Time: 1204-1222 PT Time Calculation (min): 18 min  PT Assessment / Plan / Recommendation Comments on Treatment Session  Pt required assist to complete hygiene s/p BM. ambulation limited by lower left back pain. Pt to con't to benefit from ST-SNF to achieve safe independent function for safe transition home and decrease burden of care on spouse.    Follow Up Recommendations  SNF;Supervision/Assistance - 24 hour     Does the patient have the potential to tolerate intense rehabilitation     Barriers to Discharge        Equipment Recommendations  None recommended by PT    Recommendations for Other Services    Frequency Min 5X/week   Plan Discharge plan remains appropriate;Frequency remains appropriate    Precautions / Restrictions Precautions Precautions: Back;Fall Precaution Booklet Issued: Yes (comment) Precaution Comments: pt able to recall 3/3 precautions Required Braces or Orthoses: Spinal Brace Spinal Brace: Lumbar corset Restrictions Weight Bearing Restrictions: No   Pertinent Vitals/Pain 7/10 surgical back pain (mostly on L side)    Mobility  Bed Mobility Bed Mobility: Left Sidelying to Sit;Sitting - Scoot to Edge of Bed Rolling Left: 4: Min guard;With rail Sitting - Scoot to Edge of Bed: 5: Supervision Details for Bed Mobility Assistance: pt with strong use of bedrail Transfers Transfers: Sit to Stand;Stand to Sit Sit to Stand: 4: Min guard;With upper extremity assist;From bed Stand to Sit: 4: Min guard;With upper extremity assist;To chair/3-in-1 Details for Transfer Assistance: v/c's for safety and hand placements Ambulation/Gait Ambulation/Gait Assistance: 4: Min guard Ambulation Distance (Feet): 50 Feet Assistive device: Rolling walker Ambulation/Gait Assistance Details: limited this date by lower left flank pain near surgical site Gait  Pattern: Step-to pattern;Decreased stride length;Decreased hip/knee flexion - left;Decreased weight shift to left;Decreased stance time - left;Decreased step length - right;Narrow base of support Gait velocity: slow General Gait Details: pt with h/o L drop foot however pt able to manage well    Exercises     PT Diagnosis:    PT Problem List:   PT Treatment Interventions:     PT Goals Acute Rehab PT Goals PT Goal: Supine/Side to Sit - Progress: Progressing toward goal PT Goal: Sit to Supine/Side - Progress: Progressing toward goal PT Goal: Sit to Stand - Progress: Progressing toward goal PT Goal: Stand to Sit - Progress: Progressing toward goal PT Goal: Ambulate - Progress: Progressing toward goal  Visit Information  Last PT Received On: 09/23/12 Assistance Needed: +1    Subjective Data  Subjective: Pt received in L sidelying with request to use bathroom.   Cognition  Cognition Overall Cognitive Status: Appears within functional limits for tasks assessed/performed Orientation Level: Appears intact for tasks assessed Behavior During Session: [facility] for tasks performed    Balance     End of Session PT - End of Session Equipment Utilized During Treatment: Gait belt;Back brace Activity Tolerance: Patient limited by pain Patient left: in chair;with call bell/phone within reach;with family/visitor present Nurse Communication: Mobility status   GP     Marcene Brawn 09/23/2012, 1:49 PM  Lewis Shock, PT, DPT Pager #: [phone] Office #: [phone]

## 2012-09-24 MED FILL — Heparin Sodium (Porcine) Inj 1000 Unit/ML: INTRAMUSCULAR | Qty: 30 | Status: AC

## 2012-09-24 MED FILL — Sodium Chloride IV Soln 0.9%: INTRAVENOUS | Qty: 2000 | Status: AC

## 2012-09-25 NOTE — Discharge Summary (Signed)
Doing well  DC home

## 2014-01-11 IMAGING — RF DG LUMBAR SPINE 2-3V
1 series · 3 of 3 positions shown · non-contrast
Comparison: None.

CLINICAL DATA: Back pain

DG C-ARM 1-60 MIN,LUMBAR SPINE - 2-3 VIEW

[Series 1: run · 3 of 3 slices shown]
[im 1/3]
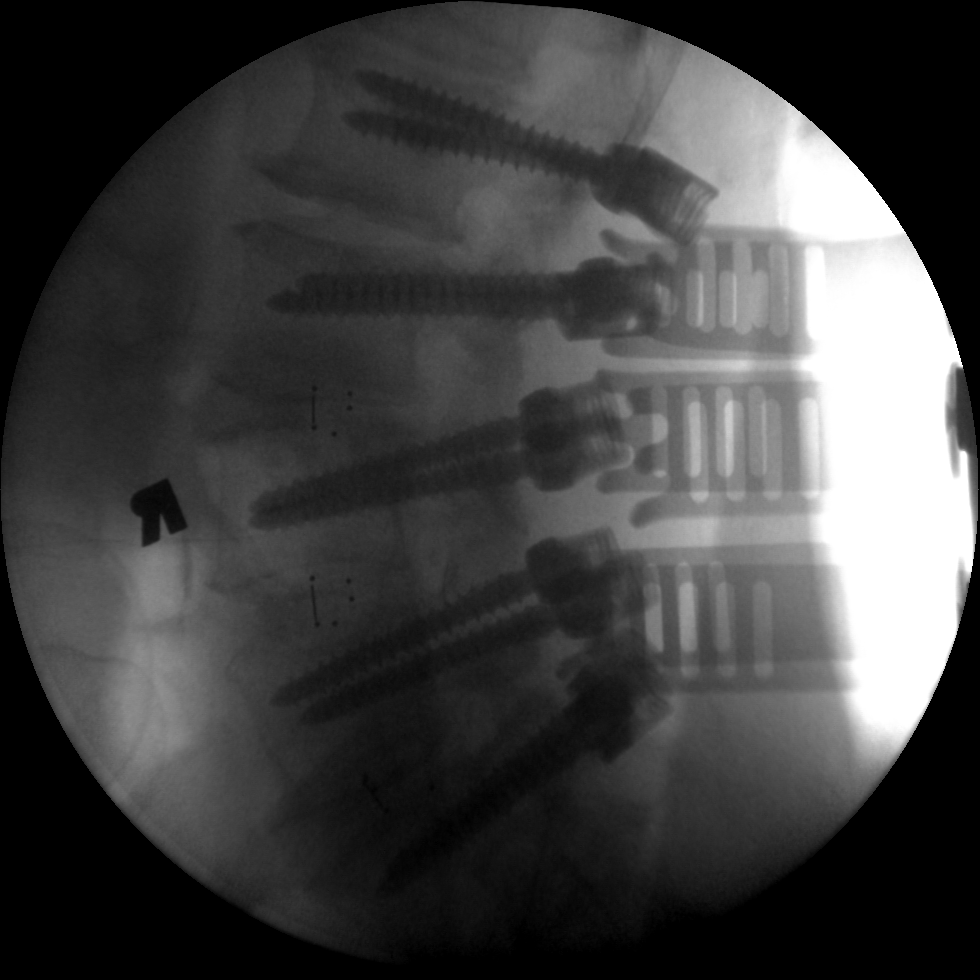
[im 2/3]
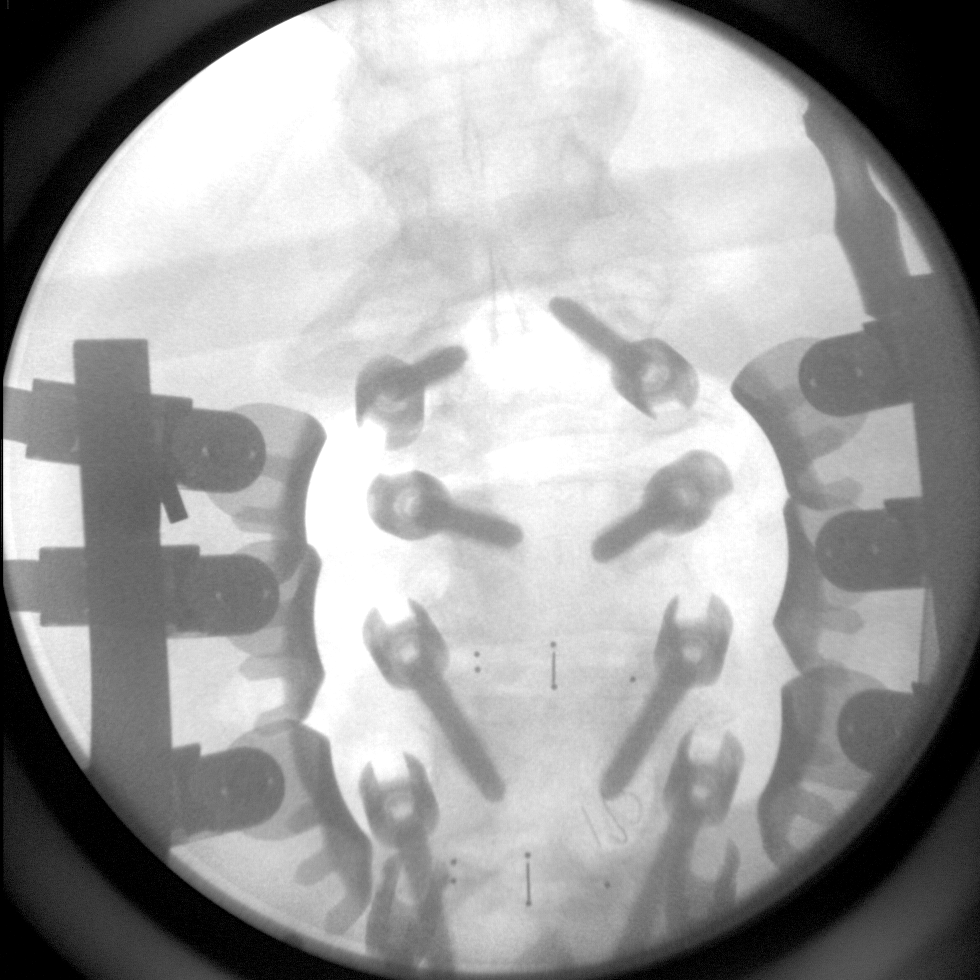
[im 3/3]
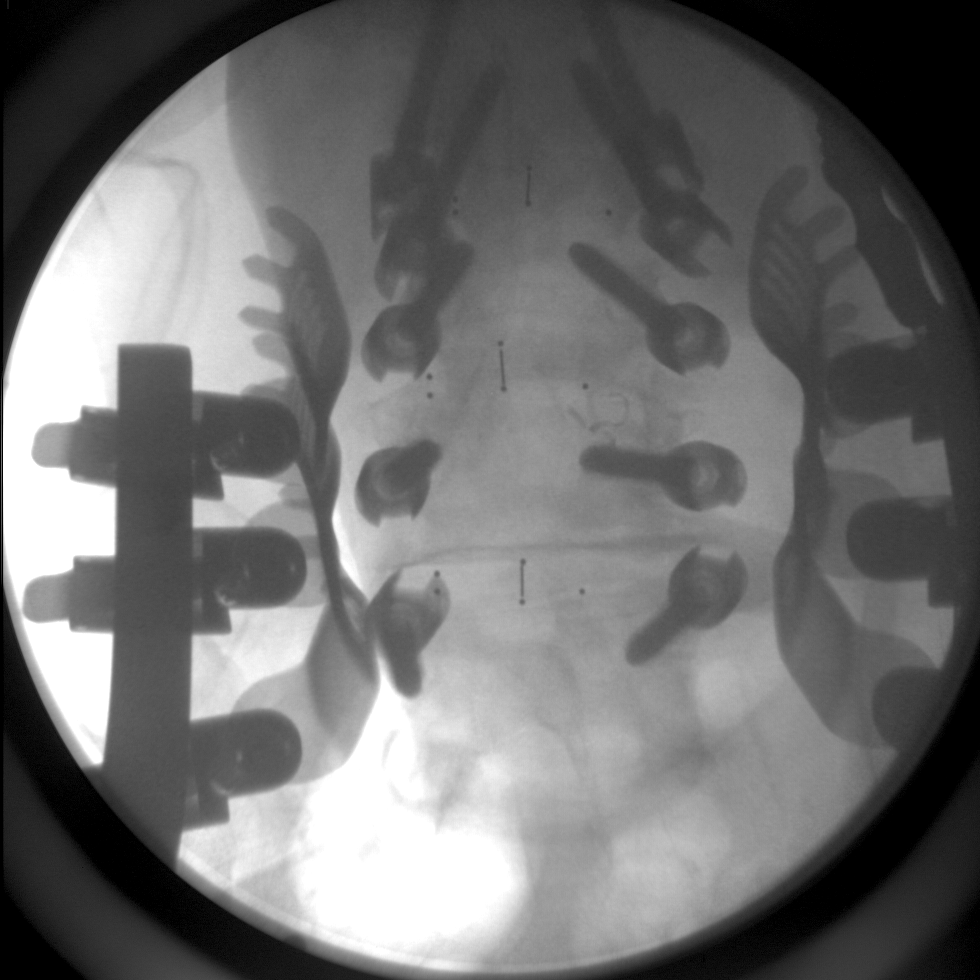

[3 of 3 positions shown; findings below may reference images not displayed]

FINDINGS: Transitional anatomy is present. The history reports L2-
S1 fusion.  Pedicle screws are assumed to be in the L2-S1 vertebral
bodies on these AP lateral C-arm films.  Interbody cages are seen
at L3-4, L4-5, and L5-S1.  The last disc space which is fused is
present at the S1-2.
IMPRESSION: As above peri

## 2014-01-11 IMAGING — CR DG CHEST 2V
2 series · 2 of 2 positions shown · non-contrast
Comparison: None.

CLINICAL DATA: Foraminal stenosis in the lumbar spine.
Preoperative respiratory exam.

CHEST - 2 VIEW

[w chest pa]
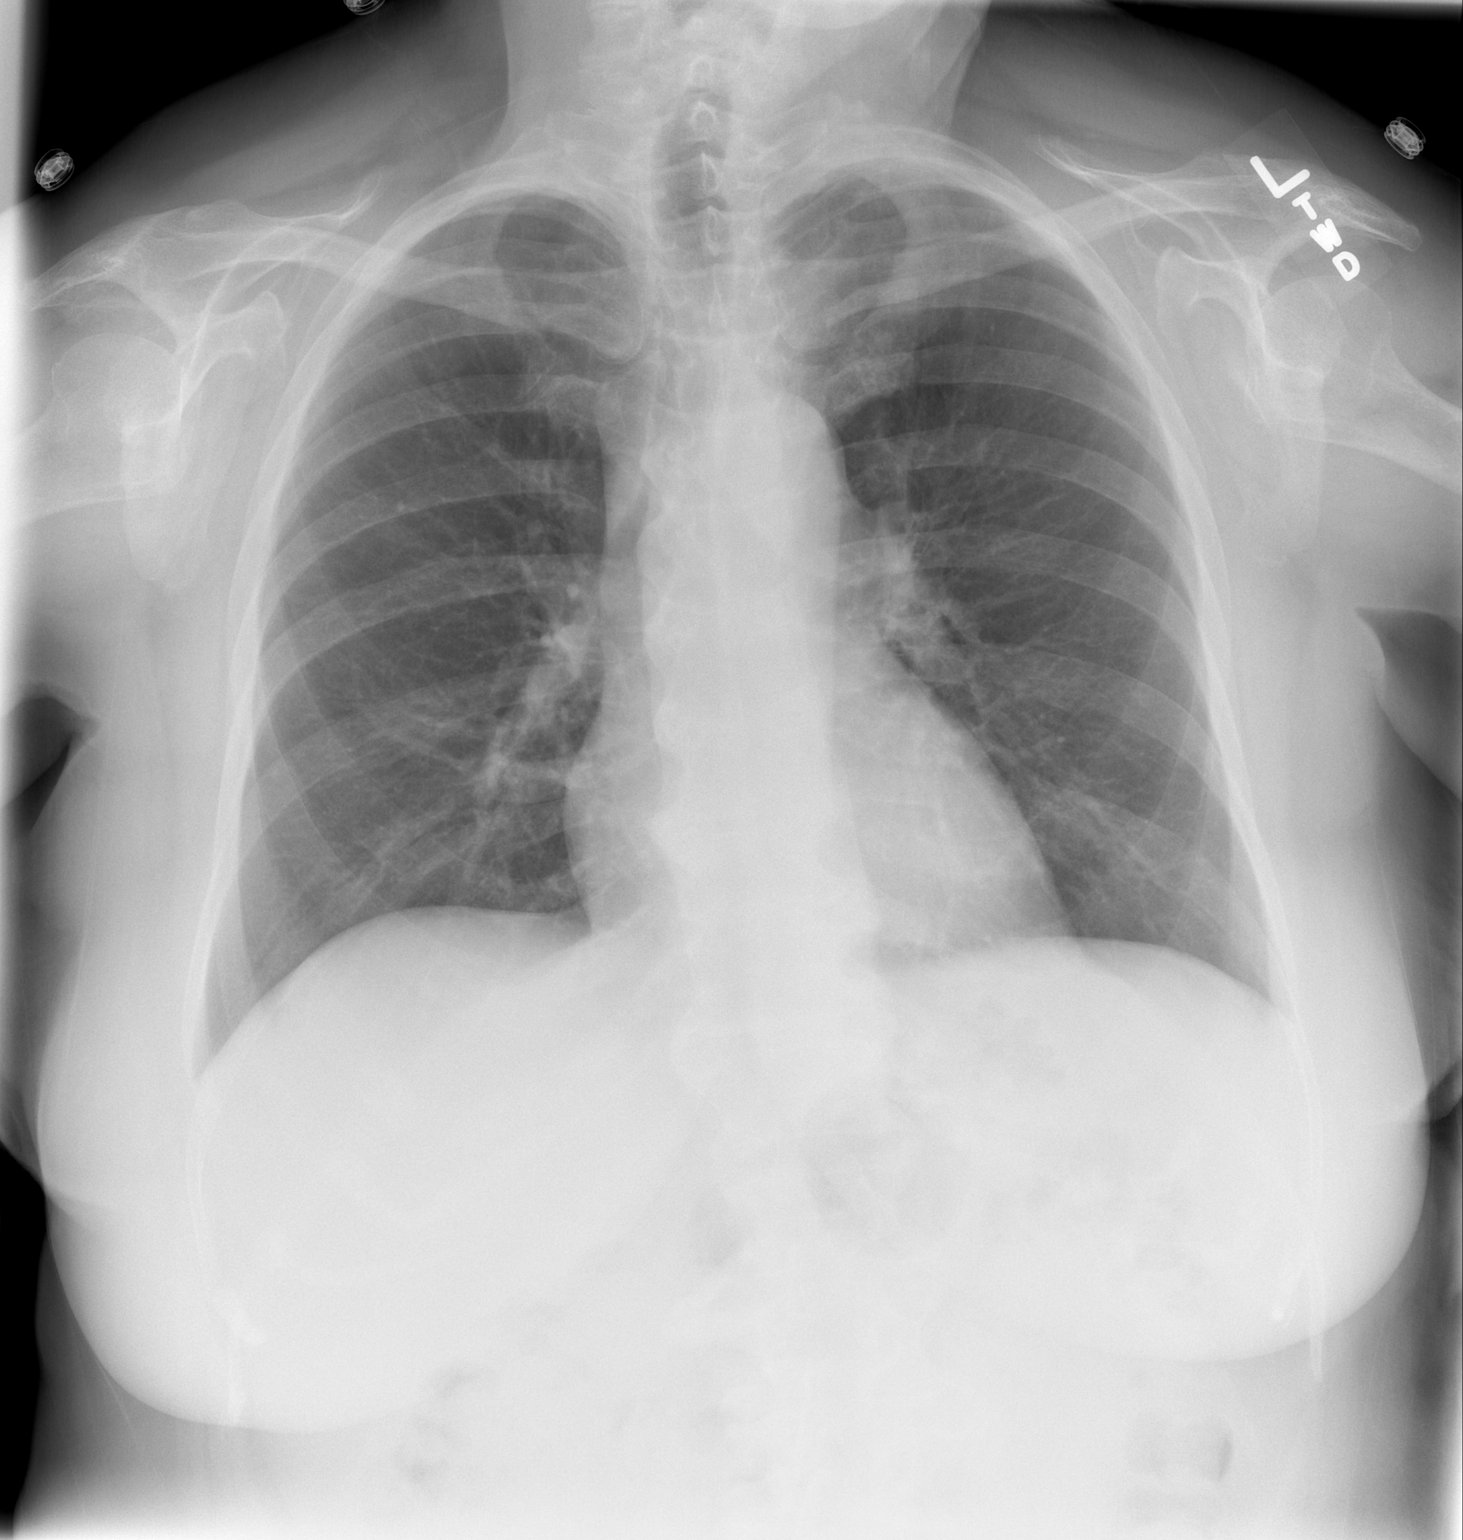

[w chest lat]
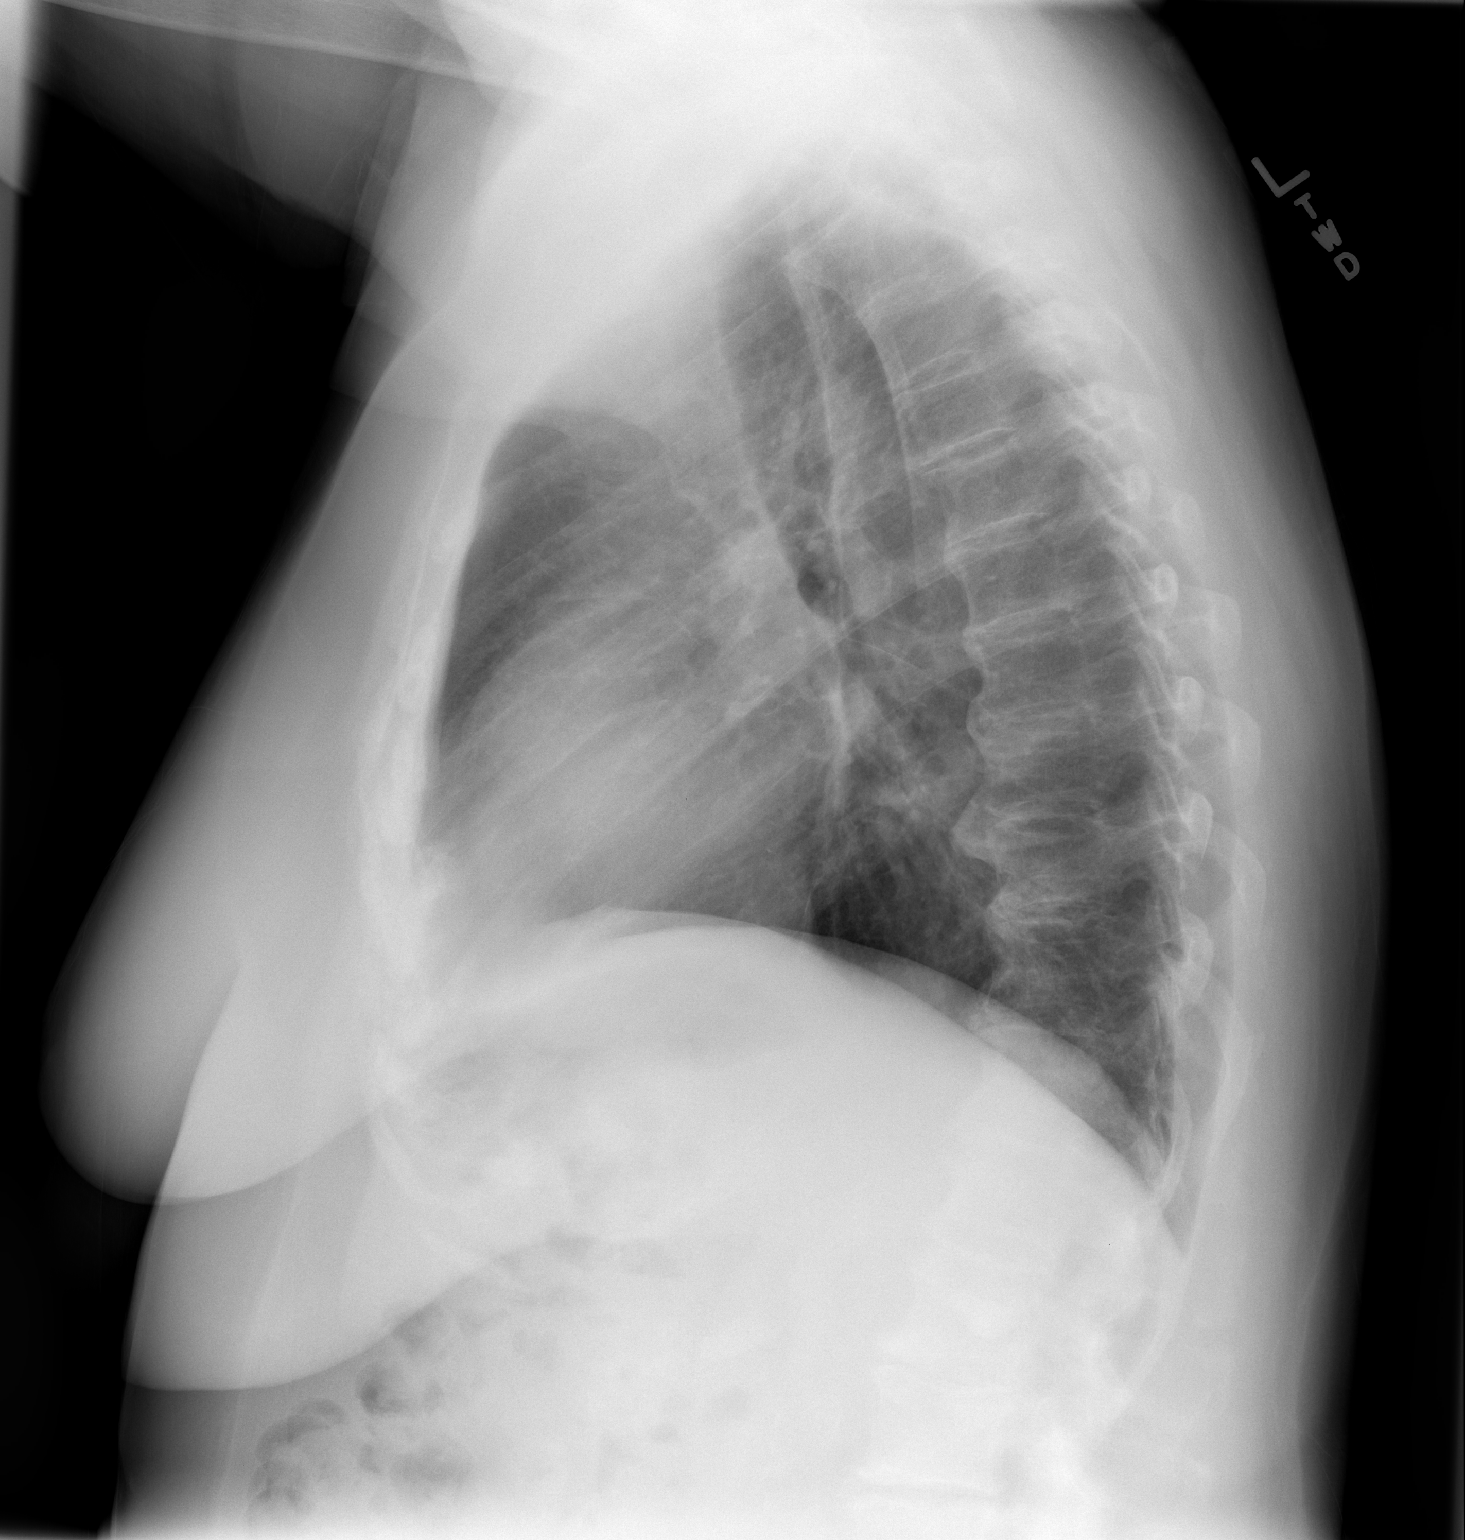

[2 of 2 positions shown; findings below may reference images not displayed]

FINDINGS: The heart size and pulmonary vascularity are normal and
the lungs are clear.  No acute osseous abnormality.
IMPRESSION: No acute disease.

## 2018-04-29 ENCOUNTER — Encounter: Payer: Self-pay | Admitting: Gastroenterology

## 2018-07-28 ENCOUNTER — Telehealth: Payer: Self-pay | Admitting: *Deleted

## 2018-07-28 ENCOUNTER — Ambulatory Visit (INDEPENDENT_AMBULATORY_CARE_PROVIDER_SITE_OTHER): Payer: Medicare Other | Admitting: Gastroenterology

## 2018-07-28 ENCOUNTER — Encounter: Payer: Self-pay | Admitting: Gastroenterology

## 2018-07-28 ENCOUNTER — Encounter: Payer: Self-pay | Admitting: *Deleted

## 2018-07-28 ENCOUNTER — Other Ambulatory Visit: Payer: Self-pay | Admitting: *Deleted

## 2018-07-28 DIAGNOSIS — Z86010 Personal history of colonic polyps: Secondary | ICD-10-CM

## 2018-07-28 MED ORDER — NA SULFATE-K SULFATE-MG SULF 17.5-3.13-1.6 GM/177ML PO SOLN: 1.0000 | ORAL | 0 refills | Status: DC

## 2018-07-28 NOTE — Progress Notes (Addendum)
Primary Care Physician:  Josem Kaufmann, MD  Primary Gastroenterologist:  Garfield Cornea, MD   Chief Complaint  Patient presents with  . Consult    TCS. alst had done 10 yrs ago. Had a polyps on TCS 15 yrs ago    HPI:  Danielle Blair is a 77 y.o. female here at the request of Dr. Pat Kocher for colonoscopy.  Patient requesting Dr. Gala Romney. Patient reports her last colonoscopy was with Dr. West Carbo or Dr. Algis Greenhouse.  She reports previously having colon polyps but believes her last colonoscopy was about 10 years ago and did not have any polyps.  No family history of colon cancer.  Patient has remote Hemoccult positive stool repeatedly found by gyn on exam requiring EGD and capsule study in the past. Requested records.   She denies any constipation or diarrhea.  No melena or rectal bleeding.  Denies abdominal pain.  No upper GI symptoms.  We discussed possibility of no future colonoscopies given her age but she is pretty persistent about wanting one more.  She reports a lot of cancer in the family, no colon cancer.  Her twin sister had pancreatic cancer, she has another sister and a daughter who both have breast cancer.    Current Outpatient Medications  Medication Sig Dispense Refill  . aspirin EC 81 MG tablet Take 81 mg by mouth daily.    . Calcium Carbonate-Vitamin D (CALTRATE 600+D PO) Take 1 tablet by mouth daily.     . Cholecalciferol (VITAMIN D-3) 1000 UNITS CAPS Take 2 capsules by mouth daily.    Marland Kitchen ezetimibe (ZETIA) 10 MG tablet Take 10 mg by mouth daily.    Marland Kitchen glipiZIDE (GLUCOTROL XL) 5 MG 24 hr tablet Take 5 mg by mouth 2 (two) times daily.     Marland Kitchen levothyroxine (SYNTHROID, LEVOTHROID) 125 MCG tablet Take 125 mcg by mouth daily.    . metFORMIN (GLUCOPHAGE) 500 MG tablet Take 500 mg by mouth 2 (two) times daily with a meal.     . traZODone (DESYREL) 50 MG tablet Take 50 mg by mouth at bedtime.     No current facility-administered medications for this visit.     Allergies as of  07/28/2018 - Review Complete 07/28/2018  Allergen Reaction Noted  . Morphine Itching 02/17/2016    Past Medical History:  Diagnosis Date  . Arthritis    back  . Diabetes mellitus without complication ([place])    takes Metrformin and Glipizide daily  . Foot drop    wears a leg brace-left  . Gastric erosions   . GERD (gastroesophageal reflux disease)    occasionally takes Tums  . History of bladder infections    in her 40's but none since  . History of colon polyps   . Hyperlipidemia    takes tricor daily  . Hypertension    takes Lisinopril daily  . Hypothyroidism    takes Levothyroxine daily  . Scoliosis     Past Surgical History:  Procedure Laterality Date  . ABDOMINAL HYSTERECTOMY  1994  . BACK SURGERY  1996  . BREAST SURGERY  1994   left   . CARPAL TUNNEL RELEASE     bilateral  . CERVICAL CONE BIOPSY    . COLONOSCOPY    . ESOPHAGOGASTRODUODENOSCOPY    . fatty tumor removed     from under right arm  . GIVENS CAPSULE STUDY    . HERNIA REPAIR  2000  . surgery on left foot and ankle  2007  .  THYROIDECTOMY  2005  . TRANSFORAMINAL LUMBAR INTERBODY FUSION (TLIF) WITH PEDICLE SCREW FIXATION 4 LEVEL  09/19/2012   Procedure: TRANSFORAMINAL LUMBAR INTERBODY FUSION (TLIF) WITH PEDICLE SCREW FIXATION 4 LEVEL;  Surgeon: Erline Levine, MD;  Location: [facility];  Service: Neurosurgery;  Laterality: N/A;  Lumbar two to sacral one redo laminectomy with a decompression and transforaminal lumbar interbody fusion    Family History  Problem Relation Age of Onset  . Heart disease Mother        pacemaker  . Lung cancer Father 77       smoker, worked on automobile  . Pancreatic cancer Sister 8       deceased, twin sister  . Breast cancer Daughter   . Breast cancer Sister     Social History   Socioeconomic History  . Marital status: Married    Spouse name: Not on file  . Number of children: Not on file  . Years of education: Not on file  . Highest education level: Not on  file  Occupational History  . Not on file  Social Needs  . Financial resource strain: Not on file  . Food insecurity:    Worry: Not on file    Inability: Not on file  . Transportation needs:    Medical: Not on file    Non-medical: Not on file  Tobacco Use  . Smoking status: Never Smoker  . Smokeless tobacco: Never Used  Substance and Sexual Activity  . Alcohol use: No  . Drug use: No  . Sexual activity: Yes    Birth control/protection: Surgical  Lifestyle  . Physical activity:    Days per week: Not on file    Minutes per session: Not on file  . Stress: Not on file  Relationships  . Social connections:    Talks on phone: Not on file    Gets together: Not on file    Attends religious service: Not on file    Active member of club or organization: Not on file    Attends meetings of clubs or organizations: Not on file    Relationship status: Not on file  . Intimate partner violence:    Fear of current or ex partner: Not on file    Emotionally abused: Not on file    Physically abused: Not on file    Forced sexual activity: Not on file  Other Topics Concern  . Not on file  Social History Narrative  . Not on file      ROS:  General: Negative for anorexia, weight loss, fever, chills, fatigue, weakness. Eyes: Negative for vision changes.  ENT: Negative for hoarseness, difficulty swallowing , nasal congestion. CV: Negative for chest pain, angina, palpitations, dyspnea on exertion, peripheral edema.  Respiratory: Negative for dyspnea at rest, dyspnea on exertion, cough, sputum, wheezing.  GI: See history of present illness. GU:  Negative for dysuria, hematuria, urinary incontinence, urinary frequency, nocturnal urination.  MS: Negative for joint pain, low back pain. Left foot drop Derm: Negative for rash or itching.  Neuro: Negative for weakness, abnormal sensation, seizure, frequent headaches, memory loss, confusion.  Psych: Negative for anxiety, depression, suicidal  ideation, hallucinations.  Endo: Negative for unusual weight change.  Heme: Negative for bruising or bleeding. Allergy: Negative for rash or hives.    Physical Examination:  BP 140/62   Pulse 72   Temp (!) 97 F (36.1 C) (Oral)   Ht 5\' 3"  (1.6 m)   Wt 189 lb 9.6 oz (  86 kg)   BMI 33.59 kg/m    General: Well-nourished, well-developed in no acute distress.  Head: Normocephalic, atraumatic.   Eyes: Conjunctiva pink, no icterus. Mouth: Oropharyngeal mucosa moist and pink , no lesions erythema or exudate. Neck: Supple without thyromegaly, masses, or lymphadenopathy.  Lungs: Clear to auscultation bilaterally.  Heart: Regular rate and rhythm, no murmurs rubs or gallops.  Abdomen: Bowel sounds are normal, nontender, nondistended, no hepatosplenomegaly or masses, no abdominal bruits or    hernia , no rebound or guarding.   Rectal: deferred Extremities: No lower extremity edema. No clubbing or deformities.  Neuro: Alert and oriented x 4 , grossly normal neurologically.  Skin: Warm and dry, no rash or jaundice.   Psych: Alert and cooperative, normal mood and affect.

## 2018-07-28 NOTE — Telephone Encounter (Signed)
Pre-op scheduled for 08/15/18 at 1:15pm . LMTCB. Letter mailed.

## 2018-07-28 NOTE — Patient Instructions (Signed)
Colonoscopy as scheduled. See separate instructions.   We will obtain copy of your prior colonoscopy, upper endoscopy, capsule records for review.

## 2018-07-29 ENCOUNTER — Telehealth: Payer: Self-pay | Admitting: Internal Medicine

## 2018-07-29 NOTE — Telephone Encounter (Signed)
Pt was returning a call from [facility] from yesterday and is aware of pre op appt. If you need to call her back her number is [phone]

## 2018-07-29 NOTE — Telephone Encounter (Signed)
noted 

## 2018-07-31 ENCOUNTER — Encounter: Payer: Self-pay | Admitting: Gastroenterology

## 2018-07-31 DIAGNOSIS — Z860100 Personal history of colon polyps, unspecified: Secondary | ICD-10-CM | POA: Insufficient documentation

## 2018-07-31 NOTE — Assessment & Plan Note (Signed)
Very pleasant 77 year old female in reasonably good health who presents today requesting surveillance colonoscopy for history of colon polyps.  Her primary care provider recommended she have at least one last colonoscopy, patient desires this as well.  Previous GI procedures done by Dr. Claudie Revering.  We have requested records for review.  Plan for deep sedation as she has had in the past.  I have discussed the risks, alternatives, benefits with regards to but not limited to the risk of reaction to medication, bleeding, infection, perforation and the patient is agreeable to proceed. Written consent to be obtained.

## 2018-07-31 NOTE — Progress Notes (Signed)
Colonoscopy April 29, 2012 by Dr. West Carbo: Indications: prior colon polyps.  Findings: scattered diverticulosis, otherwise normal.  Surveillance colonoscopy recommended in 5 years.  Sedation was propofol.  Capsule endoscopy January 2010 for obscure GI bleeding: Small telangiectasias and erosions scattered throughout the small bowel.  EGD December 2009 for heme positive stool: Antral erosions with chronic gastritis on biopsy, no H. pylori.  Possible duodenal mass versus severe duodenitis, biopsies showed chronic inflammation with unremarkable villi.  Large duodenal diverticula post bulbar area.  Colonoscopy December 2007 for history of colon polyps, heme positive stool, unremarkable.

## 2018-07-31 NOTE — Progress Notes (Signed)
CC'D TO PCP °

## 2018-07-31 NOTE — H&P (View-Only) (Signed)
Colonoscopy April 29, 2012 by Dr. West Carbo: Indications: prior colon polyps.  Findings: scattered diverticulosis, otherwise normal.  Surveillance colonoscopy recommended in 5 years.  Sedation was propofol.  Capsule endoscopy January 2010 for obscure GI bleeding: Small telangiectasias and erosions scattered throughout the small bowel.  EGD December 2009 for heme positive stool: Antral erosions with chronic gastritis on biopsy, no H. pylori.  Possible duodenal mass versus severe duodenitis, biopsies showed chronic inflammation with unremarkable villi.  Large duodenal diverticula post bulbar area.  Colonoscopy December 2007 for history of colon polyps, heme positive stool, unremarkable.

## 2018-08-05 NOTE — Patient Instructions (Signed)
Danielle Blair  08/05/2018     @PREFPERIOPPHARMACY @   Your procedure is scheduled on  08/21/2018   Report to [facility] at  650   A.M.  Call this number if you have problems the morning of surgery:  [phone]   Remember:  Follow the diet and prep instructions given to you by Dr Roseanne Kaufman office.                      Take these medicines the morning of surgery with A SIP OF WATER  Levothyroxine.    Do not wear jewelry, make-up or nail polish.  Do not wear lotions, powders, or perfumes, or deodorant.  Do not shave 48 hours prior to surgery.  Men may shave face and neck.  Do not bring valuables to the hospital.  [facility] is not responsible for any belongings or valuables.  Contacts, dentures or bridgework may not be worn into surgery.  Leave your suitcase in the car.  After surgery it may be brought to your room.  For patients admitted to the hospital, discharge time will be determined by your treatment team.  Patients discharged the day of surgery will not be allowed to drive home.   Name and phone number of your driver:   family Special instructions:  DO NOT take any medications for diabetes the morning of your procedure.  Please read over the following fact sheets that you were given. Anesthesia Post-op Instructions and Care and Recovery After Surgery       Colonoscopy, Adult A colonoscopy is an exam to look at the large intestine. It is done to check for problems, such as:  Lumps (tumors).  Growths (polyps).  Swelling (inflammation).  Bleeding.  What happens before the procedure? Eating and drinking Follow instructions from your doctor about eating and drinking. These instructions may include:  A few days before the procedure - follow a low-fiber diet. ? Avoid nuts. ? Avoid seeds. ? Avoid dried fruit. ? Avoid raw fruits. ? Avoid vegetables.  1-3 days before the procedure - follow a clear liquid diet. Avoid liquids that have red or  purple dye. Drink only clear liquids, such as: ? Clear broth or bouillon. ? Black coffee or tea. ? Clear juice. ? Clear soft drinks or sports drinks. ? Gelatin dessert. ? Popsicles.  On the day of the procedure - do not eat or drink anything during the 2 hours before the procedure.  Bowel prep If you were prescribed an oral bowel prep:  Take it as told by your doctor. Starting the day before your procedure, you will need to drink a lot of liquid. The liquid will cause you to poop (have bowel movements) until your poop is almost clear or light green.  If your skin or butt gets irritated from diarrhea, you may: ? Wipe the area with wipes that have medicine in them, such as adult wet wipes with aloe and vitamin E. ? Put something on your skin that soothes the area, such as petroleum jelly.  If you throw up (vomit) while drinking the bowel prep, take a break for up to 60 minutes. Then begin the bowel prep again. If you keep throwing up and you cannot take the bowel prep without throwing up, call your doctor.  General instructions  Ask your doctor about changing or stopping your normal medicines. This is important if you take diabetes medicines or blood thinners.  Plan to have someone take you home from the hospital or clinic. What happens during the procedure?  An IV tube may be put into one of your veins.  You will be given medicine to help you relax (sedative).  To reduce your risk of infection: ? Your doctors will wash their hands. ? Your anal area will be washed with soap.  You will be asked to lie on your side with your knees bent.  Your doctor will get a long, thin, flexible tube ready. The tube will have a camera and a light on the end.  The tube will be put into your anus.  The tube will be gently put into your large intestine.  Air will be delivered into your large intestine to keep it open. You may feel some pressure or cramping.  The camera will be used to take  photos.  A small tissue sample may be removed from your body to be looked at under a microscope (biopsy). If any possible problems are found, the tissue will be sent to a lab for testing.  If small growths are found, your doctor may remove them and have them checked for cancer.  The tube that was put into your anus will be slowly removed. The procedure may vary among doctors and hospitals. What happens after the procedure?  Your doctor will check on you often until the medicines you were given have worn off.  Do not drive for 24 hours after the procedure.  You may have a small amount of blood in your poop.  You may pass gas.  You may have mild cramps or bloating in your belly (abdomen).  It is up to you to get the results of your procedure. Ask your doctor, or the department performing the procedure, when your results will be ready. This information is not intended to replace advice given to you by your health care provider. Make sure you discuss any questions you have with your health care provider. Document Released: 09/08/2010 Document Revised: 06/06/2016 Document Reviewed: 10/18/2015 Elsevier Interactive Patient Education  2017 [employer]  Colonoscopy, Adult, Care After This sheet gives you information about how to care for yourself after your procedure. Your health care provider may also give you more specific instructions. If you have problems or questions, contact your health care provider. What can I expect after the procedure? After the procedure, it is common to have:  A small amount of blood in your stool for 24 hours after the procedure.  Some gas.  Mild abdominal cramping or bloating.  Follow these instructions at home: General instructions   For the first 24 hours after the procedure: ? Do not drive or use machinery. ? Do not sign important documents. ? Do not drink alcohol. ? Do your regular daily activities at a slower pace than normal. ? Eat soft,  easy-to-digest foods. ? Rest often.  Take over-the-counter or prescription medicines only as told by your health care provider.  It is up to you to get the results of your procedure. Ask your health care provider, or the department performing the procedure, when your results will be ready. Relieving cramping and bloating  Try walking around when you have cramps or feel bloated.  Apply heat to your abdomen as told by your health care provider. Use a heat source that your health care provider recommends, such as a moist heat pack or a heating pad. ? Place a towel between your skin and the heat source. ? Leave  the heat on for 20-30 minutes. ? Remove the heat if your skin turns bright red. This is especially important if you are unable to feel pain, heat, or cold. You may have a greater risk of getting burned. Eating and drinking  Drink enough fluid to keep your urine clear or pale yellow.  Resume your normal diet as instructed by your health care provider. Avoid heavy or fried foods that are hard to digest.  Avoid drinking alcohol for as long as instructed by your health care provider. Contact a health care provider if:  You have blood in your stool 2-3 days after the procedure. Get help right away if:  You have more than a small spotting of blood in your stool.  You pass large blood clots in your stool.  Your abdomen is swollen.  You have nausea or vomiting.  You have a fever.  You have increasing abdominal pain that is not relieved with medicine. This information is not intended to replace advice given to you by your health care provider. Make sure you discuss any questions you have with your health care provider. Document Released: 03/20/2004 Document Revised: 04/30/2016 Document Reviewed: 10/18/2015 Elsevier Interactive Patient Education  2018 [place] Anesthesia is a term that refers to techniques, procedures, and medicines that help a  person stay safe and comfortable during a medical procedure. Monitored anesthesia care, or sedation, is one type of anesthesia. Your anesthesia specialist may recommend sedation if you will be having a procedure that does not require you to be unconscious, such as:  Cataract surgery.  A dental procedure.  A biopsy.  A colonoscopy.  During the procedure, you may receive a medicine to help you relax (sedative). There are three levels of sedation:  Mild sedation. At this level, you may feel awake and relaxed. You will be able to follow directions.  Moderate sedation. At this level, you will be sleepy. You may not remember the procedure.  Deep sedation. At this level, you will be asleep. You will not remember the procedure.  The more medicine you are given, the deeper your level of sedation will be. Depending on how you respond to the procedure, the anesthesia specialist may change your level of sedation or the type of anesthesia to fit your needs. An anesthesia specialist will monitor you closely during the procedure. Let your health care provider know about:  Any allergies you have.  All medicines you are taking, including vitamins, herbs, eye drops, creams, and over-the-counter medicines.  Any use of steroids (by mouth or as a cream).  Any problems you or family members have had with sedatives and anesthetic medicines.  Any blood disorders you have.  Any surgeries you have had.  Any medical conditions you have, such as sleep apnea.  Whether you are pregnant or may be pregnant.  Any use of cigarettes, alcohol, or street drugs. What are the risks? Generally, this is a safe procedure. However, problems may occur, including:  Getting too much medicine (oversedation).  Nausea.  Allergic reaction to medicines.  Trouble breathing. If this happens, a breathing tube may be used to help with breathing. It will be removed when you are awake and breathing on your own.  Heart  trouble.  Lung trouble.  Before the procedure Staying hydrated Follow instructions from your health care provider about hydration, which may include:  Up to 2 hours before the procedure - you may continue to drink clear liquids, such as water, clear fruit  juice, black coffee, and plain tea.  Eating and drinking restrictions Follow instructions from your health care provider about eating and drinking, which may include:  8 hours before the procedure - stop eating heavy meals or foods such as meat, fried foods, or fatty foods.  6 hours before the procedure - stop eating light meals or foods, such as toast or cereal.  6 hours before the procedure - stop drinking milk or drinks that contain milk.  2 hours before the procedure - stop drinking clear liquids.  Medicines Ask your health care provider about:  Changing or stopping your regular medicines. This is especially important if you are taking diabetes medicines or blood thinners.  Taking medicines such as aspirin and ibuprofen. These medicines can thin your blood. Do not take these medicines before your procedure if your health care provider instructs you not to.  Tests and exams  You will have a physical exam.  You may have blood tests done to show: ? How well your kidneys and liver are working. ? How well your blood can clot.  General instructions  Plan to have someone take you home from the hospital or clinic.  If you will be going home right after the procedure, plan to have someone with you for 24 hours.  What happens during the procedure?  Your blood pressure, heart rate, breathing, level of pain and overall condition will be monitored.  An IV tube will be inserted into one of your veins.  Your anesthesia specialist will give you medicines as needed to keep you comfortable during the procedure. This may mean changing the level of sedation.  The procedure will be performed. After the procedure  Your blood  pressure, heart rate, breathing rate, and blood oxygen level will be monitored until the medicines you were given have worn off.  Do not drive for 24 hours if you received a sedative.  You may: ? Feel sleepy, clumsy, or nauseous. ? Feel forgetful about what happened after the procedure. ? Have a sore throat if you had a breathing tube during the procedure. ? Vomit. This information is not intended to replace advice given to you by your health care provider. Make sure you discuss any questions you have with your health care provider. Document Released: 05/02/2005 Document Revised: 01/13/2016 Document Reviewed: 11/27/2015 Elsevier Interactive Patient Education  2018 [place], Care After These instructions provide you with information about caring for yourself after your procedure. Your health care provider may also give you more specific instructions. Your treatment has been planned according to current medical practices, but problems sometimes occur. Call your health care provider if you have any problems or questions after your procedure. What can I expect after the procedure? After your procedure, it is common to:  Feel sleepy for several hours.  Feel clumsy and have poor balance for several hours.  Feel forgetful about what happened after the procedure.  Have poor judgment for several hours.  Feel nauseous or vomit.  Have a sore throat if you had a breathing tube during the procedure.  Follow these instructions at home: For at least 24 hours after the procedure:   Do not: ? Participate in activities in which you could fall or become injured. ? Drive. ? Use heavy machinery. ? Drink alcohol. ? Take sleeping pills or medicines that cause drowsiness. ? Make important decisions or sign legal documents. ? Take care of children on your own.  Rest. Eating and drinking  Follow  the diet that is recommended by your health care provider.  If you  vomit, drink water, juice, or soup when you can drink without vomiting.  Make sure you have little or no nausea before eating solid foods. General instructions  Have a responsible adult stay with you until you are awake and alert.  Take over-the-counter and prescription medicines only as told by your health care provider.  If you smoke, do not smoke without supervision.  Keep all follow-up visits as told by your health care provider. This is important. Contact a health care provider if:  You keep feeling nauseous or you keep vomiting.  You feel light-headed.  You develop a rash.  You have a fever. Get help right away if:  You have trouble breathing. This information is not intended to replace advice given to you by your health care provider. Make sure you discuss any questions you have with your health care provider. Document Released: 11/27/2015 Document Revised: 03/28/2016 Document Reviewed: 11/27/2015 Elsevier Interactive Patient Education  [employer].

## 2018-08-15 ENCOUNTER — Other Ambulatory Visit: Payer: Self-pay

## 2018-08-15 ENCOUNTER — Encounter (HOSPITAL_COMMUNITY): Payer: Self-pay

## 2018-08-15 ENCOUNTER — Encounter (HOSPITAL_COMMUNITY)
Admission: RE | Admit: 2018-08-15 | Discharge: 2018-08-15 | Disposition: A | Discharge status: Home / Self Care | Payer: Medicare Other | Source: Ambulatory Visit | Attending: Internal Medicine | Admitting: Internal Medicine

## 2018-08-15 DIAGNOSIS — Z01818 Encounter for other preprocedural examination: Secondary | ICD-10-CM | POA: Insufficient documentation

## 2018-08-15 DIAGNOSIS — I4510 Unspecified right bundle-branch block: Secondary | ICD-10-CM | POA: Diagnosis not present

## 2018-08-15 DIAGNOSIS — R9431 Abnormal electrocardiogram [ECG] [EKG]: Secondary | ICD-10-CM | POA: Insufficient documentation

## 2018-08-15 LAB — CBC WITH DIFFERENTIAL/PLATELET
Abs Immature Granulocytes: 0.02 10*3/uL (ref 0.00–0.07)
BASOS ABS: 0.1 10*3/uL (ref 0.0–0.1)
Basophils Relative: 1 %
EOS PCT: 3 %
Eosinophils Absolute: 0.2 10*3/uL (ref 0.0–0.5)
HEMATOCRIT: 39.6 % (ref 36.0–46.0)
HEMOGLOBIN: 12.6 g/dL (ref 12.0–15.0)
Immature Granulocytes: 0 %
LYMPHS ABS: 1.8 10*3/uL (ref 0.7–4.0)
LYMPHS PCT: 27 %
MCH: 29.9 pg (ref 26.0–34.0)
MCHC: 31.8 g/dL (ref 30.0–36.0)
MCV: 94.1 fL (ref 80.0–100.0)
Monocytes Absolute: 0.5 10*3/uL (ref 0.1–1.0)
Monocytes Relative: 7 %
NEUTROS ABS: 4.3 10*3/uL (ref 1.7–7.7)
NRBC: 0 % (ref 0.0–0.2)
Neutrophils Relative %: 62 %
Platelets: 184 10*3/uL (ref 150–400)
RBC: 4.21 MIL/uL (ref 3.87–5.11)
RDW: 13.2 % (ref 11.5–15.5)
WBC: 6.9 10*3/uL (ref 4.0–10.5)

## 2018-08-15 LAB — BASIC METABOLIC PANEL
ANION GAP: 11 (ref 5–15)
BUN: 22 mg/dL (ref 8–23)
CHLORIDE: 104 mmol/L (ref 98–111)
CO2: 22 mmol/L (ref 22–32)
CREATININE: 0.8 mg/dL (ref 0.44–1.00)
Calcium: 9.5 mg/dL (ref 8.9–10.3)
GFR calc non Af Amer: >60 mL/min (ref >60)
Glucose, Bld: 180 mg/dL — ABNORMAL HIGH (ref 70–99)
Potassium: 4.1 mmol/L (ref 3.5–5.1)
SODIUM: 137 mmol/L (ref 135–145)

## 2018-08-21 ENCOUNTER — Other Ambulatory Visit: Payer: Self-pay

## 2018-08-21 ENCOUNTER — Ambulatory Visit (HOSPITAL_COMMUNITY)
Admission: RE | Admit: 2018-08-21 | Discharge: 2018-08-21 | Disposition: A | Discharge status: Home / Self Care | Payer: Medicare Other | Attending: Internal Medicine | Admitting: Internal Medicine

## 2018-08-21 ENCOUNTER — Ambulatory Visit (HOSPITAL_COMMUNITY): Payer: Medicare Other | Admitting: Anesthesiology

## 2018-08-21 ENCOUNTER — Encounter (HOSPITAL_COMMUNITY): Payer: Self-pay | Admitting: *Deleted

## 2018-08-21 ENCOUNTER — Encounter (HOSPITAL_COMMUNITY)
Admission: RE | Disposition: A | Discharge status: Home / Self Care | Payer: Self-pay | Source: Home / Self Care | Attending: Internal Medicine

## 2018-08-21 DIAGNOSIS — K640 First degree hemorrhoids: Secondary | ICD-10-CM

## 2018-08-21 DIAGNOSIS — Z1211 Encounter for screening for malignant neoplasm of colon: Secondary | ICD-10-CM | POA: Diagnosis present

## 2018-08-21 DIAGNOSIS — I10 Essential (primary) hypertension: Secondary | ICD-10-CM | POA: Insufficient documentation

## 2018-08-21 DIAGNOSIS — D122 Benign neoplasm of ascending colon: Secondary | ICD-10-CM | POA: Insufficient documentation

## 2018-08-21 DIAGNOSIS — E118 Type 2 diabetes mellitus with unspecified complications: Secondary | ICD-10-CM | POA: Insufficient documentation

## 2018-08-21 DIAGNOSIS — K279 Peptic ulcer, site unspecified, unspecified as acute or chronic, without hemorrhage or perforation: Secondary | ICD-10-CM | POA: Insufficient documentation

## 2018-08-21 DIAGNOSIS — E039 Hypothyroidism, unspecified: Secondary | ICD-10-CM | POA: Diagnosis not present

## 2018-08-21 DIAGNOSIS — Z86010 Personal history of colonic polyps: Secondary | ICD-10-CM | POA: Diagnosis not present

## 2018-08-21 DIAGNOSIS — K219 Gastro-esophageal reflux disease without esophagitis: Secondary | ICD-10-CM | POA: Insufficient documentation

## 2018-08-21 HISTORY — PX: COLONOSCOPY WITH PROPOFOL: SHX5780

## 2018-08-21 HISTORY — PX: POLYPECTOMY: SHX5525

## 2018-08-21 LAB — GLUCOSE, CAPILLARY
Glucose-Capillary: 191 mg/dL — ABNORMAL HIGH (ref 70–99)
Glucose-Capillary: 190 mg/dL — ABNORMAL HIGH (ref 70–99)

## 2018-08-21 SURGERY — COLONOSCOPY WITH PROPOFOL
Anesthesia: Monitor Anesthesia Care

## 2018-08-21 MED ORDER — CHLORHEXIDINE GLUCONATE CLOTH 2 % EX PADS: 6.0000 | MEDICATED_PAD | Freq: Once | CUTANEOUS | Status: DC

## 2018-08-21 MED ORDER — PROPOFOL 10 MG/ML IV BOLUS
INTRAVENOUS | Status: DC | PRN
  Administered 2018-08-21: 20 mg via INTRAVENOUS

## 2018-08-21 MED ORDER — LACTATED RINGERS IV SOLN
INTRAVENOUS | Status: DC
  Administered 2018-08-21: 08:00:00 via INTRAVENOUS

## 2018-08-21 MED ORDER — PROPOFOL 500 MG/50ML IV EMUL
INTRAVENOUS | Status: DC | PRN
  Administered 2018-08-21: 125 ug/kg/min via INTRAVENOUS

## 2018-08-21 MED ORDER — MIDAZOLAM HCL 2 MG/2ML IJ SOLN
INTRAMUSCULAR | Status: AC
  Filled 2018-08-21: qty 2

## 2018-08-21 NOTE — Interval H&P Note (Signed)
History and Physical Interval Note:  08/21/2018 7:25 AM  Danielle Blair  has presented today for surgery, with the diagnosis of h/o colon polyps  The various methods of treatment have been discussed with the patient and family. After consideration of risks, benefits and other options for treatment, the patient has consented to  Procedure(s) with comments: COLONOSCOPY WITH PROPOFOL (N/A) - 8:15am as a surgical intervention .  The patient's history has been reviewed, patient examined, no change in status, stable for surgery.  I have reviewed the patient's chart and labs.  Questions were answered to the patient's satisfaction.     Danielle Blair  No change.  Surveillance colonoscopy per plan.  The risks, benefits, limitations, alternatives and imponderables have been reviewed with the patient. Questions have been answered. All parties are agreeable.

## 2018-08-21 NOTE — Transfer of Care (Signed)
Immediate Anesthesia Transfer of Care Note  Patient: Danielle Blair  Procedure(s) Performed: COLONOSCOPY WITH PROPOFOL (N/A ) POLYPECTOMY  Patient Location: PACU  Anesthesia Type:General  Level of Consciousness: awake and patient cooperative  Airway & Oxygen Therapy: Patient Spontanous Breathing and Patient connected to nasal cannula oxygen  Post-op Assessment: Report given to RN, Post -op Vital signs reviewed and stable and Patient moving all extremities  Post vital signs: Reviewed and stable  Last Vitals:  Vitals Value Taken Time  BP    Temp    Pulse 60 08/21/2018  8:23 AM  Resp    SpO2 98 % 08/21/2018  8:23 AM  Vitals shown include unvalidated device data.  Last Pain:  Vitals:   08/21/18 0723  TempSrc: Oral  PainSc: 0-No pain      Patients Stated Pain Goal: 4 (98/33/82 [id number])  Complications: No apparent anesthesia complications

## 2018-08-21 NOTE — Addendum Note (Signed)
Addendum  created 08/21/18 0933 by Vista Deck, CRNA   Intraprocedure Meds edited

## 2018-08-21 NOTE — Op Note (Signed)
[facility] Patient Name: Danielle Blair Procedure Date: 08/21/2018 7:59 AM MRN: [id number] Date of Birth: 08/28/1940 Attending MD: Norvel Richards , MD CSN: [id number] Age: 78 Admit Type: Outpatient Procedure:                Colonoscopy Indications:              High risk colon cancer surveillance: Personal                            history of colonic polyps Providers:                Norvel Richards, MD, Janeece Riggers, RN, Randa Spike, Technician Referring MD:              Medicines:                Propofol per Anesthesia Complications:            No immediate complications. Estimated Blood Loss:     Estimated blood loss was minimal. Procedure:                Pre-Anesthesia Assessment:                           - Prior to the procedure, a History and Physical                            was performed, and patient medications and                            allergies were reviewed. The patient's tolerance of                            previous anesthesia was also reviewed. The risks                            and benefits of the procedure and the sedation                            options and risks were discussed with the patient.                            All questions were answered, and informed consent                            was obtained. Prior Anticoagulants: The patient has                            taken no previous anticoagulant or antiplatelet                            agents. ASA Grade Assessment: II - A patient with  mild systemic disease. After reviewing the risks                            and benefits, the patient was deemed in                            satisfactory condition to undergo the procedure.                           After obtaining informed consent, the colonoscope                            was passed under direct vision. Throughout the                            procedure, the  patient's blood pressure, pulse, and                            oxygen saturations were monitored continuously. The                            CF-[id number] ([id number]) scope was introduced through                            the and advanced to the the cecum, identified by                            appendiceal orifice and ileocecal valve. The                            colonoscopy was performed without difficulty. The                            patient tolerated the procedure well. The quality                            of the bowel preparation was adequate. The                            ileocecal valve, appendiceal orifice, and rectum                            were photographed. Scope In: 8:04:05 AM Scope Out: 8:14:58 AM Scope Withdrawal Time: 0 hours 7 minutes 52 seconds  Total Procedure Duration: 0 hours 10 minutes 53 seconds  Findings:      The perianal and digital rectal examinations were normal.      A 5 mm polyp was found in the ascending colon. The polyp was sessile.       The polyp was removed with a cold snare. Resection and retrieval were       complete. Estimated blood loss was minimal.      Non-bleeding internal hemorrhoids were found during retroflexion. The       hemorrhoids were mild, small and Grade I (internal hemorrhoids that do  not prolapse).      The exam was otherwise without abnormality on direct and retroflexion       views. Impression:               - One 5 mm polyp in the ascending colon, removed                            with a cold snare. Resected and retrieved.                           - Non-bleeding internal hemorrhoids.                           - The examination was otherwise normal on direct                            and retroflexion views. Moderate Sedation:      Moderate (conscious) sedation was personally administered by an       anesthesia professional. The following parameters were monitored: oxygen       saturation, heart rate, blood  pressure, respiratory rate, EKG, adequacy       of pulmonary ventilation, and response to care. Recommendation:           - Patient has a contact number available for                            emergencies. The signs and symptoms of potential                            delayed complications were discussed with the                            patient. Return to normal activities tomorrow.                            Written discharge instructions were provided to the                            patient.                           - Resume previous diet.                           - Repeat colonoscopy date to be determined after                            pending pathology results are reviewed for                            surveillance based on pathology results.                           - Return to GI office (date not yet determined). Procedure Code(s):        --- Professional ---  45385, Colonoscopy, flexible; with removal of                            tumor(s), polyp(s), or other lesion(s) by snare                            technique Diagnosis Code(s):        --- Professional ---                           Z86.010, Personal history of colonic polyps                           D12.2, Benign neoplasm of ascending colon                           K64.0, First degree hemorrhoids CPT copyright 2018 American [employer]. All rights reserved. The codes documented in this report are preliminary and upon coder review may  be revised to meet current compliance requirements. Cristopher Estimable. Aimar Shrewsbury, MD Norvel Richards, MD 08/21/2018 8:20:28 AM This report has been signed electronically. Number of Addenda: 0

## 2018-08-21 NOTE — Anesthesia Postprocedure Evaluation (Signed)
Anesthesia Post Note  Patient: Danielle Blair  Procedure(s) Performed: COLONOSCOPY WITH PROPOFOL (N/A ) POLYPECTOMY  Patient location during evaluation: PACU Anesthesia Type: General Level of consciousness: awake and alert and patient cooperative Pain management: pain level controlled Vital Signs Assessment: post-procedure vital signs reviewed and stable Respiratory status: spontaneous breathing, nonlabored ventilation and respiratory function stable Cardiovascular status: blood pressure returned to baseline Postop Assessment: no apparent nausea or vomiting Anesthetic complications: no     Last Vitals:  Vitals:   08/21/18 0830 08/21/18 0835  BP: (!) 91/42   Pulse: (!) 57 65  Resp: 16 16  Temp:    SpO2:      Last Pain:  Vitals:   08/21/18 0830  TempSrc:   PainSc: Asleep                 Johnthomas Lader J

## 2018-08-21 NOTE — Discharge Instructions (Signed)
Colonoscopy Discharge Instructions  Read the instructions outlined below and refer to this sheet in the next few weeks. These discharge instructions provide you with general information on caring for yourself after you leave the hospital. Your doctor may also give you specific instructions. While your treatment has been planned according to the most current medical practices available, unavoidable complications occasionally occur. If you have any problems or questions after discharge, call Dr. Gala Romney at [phone]. ACTIVITY  You may resume your regular activity, but move at a slower pace for the next 24 hours.   Take frequent rest periods for the next 24 hours.   Walking will help get rid of the air and reduce the bloated feeling in your belly (abdomen).   No driving for 24 hours (because of the medicine (anesthesia) used during the test).    Do not sign any important legal documents or operate any machinery for 24 hours (because of the anesthesia used during the test).  NUTRITION  Drink plenty of fluids.   You may resume your normal diet as instructed by your doctor.   Begin with a light meal and progress to your normal diet. Heavy or fried foods are harder to digest and may make you feel sick to your stomach (nauseated).   Avoid alcoholic beverages for 24 hours or as instructed.  MEDICATIONS  You may resume your normal medications unless your doctor tells you otherwise.  WHAT YOU CAN EXPECT TODAY  Some feelings of bloating in the abdomen.   Passage of more gas than usual.   Spotting of blood in your stool or on the toilet paper.  IF YOU HAD POLYPS REMOVED DURING THE COLONOSCOPY:  No aspirin products for 7 days or as instructed.   No alcohol for 7 days or as instructed.   Eat a soft diet for the next 24 hours.  FINDING OUT THE RESULTS OF YOUR TEST Not all test results are available during your visit. If your test results are not back during the visit, make an appointment  with your caregiver to find out the results. Do not assume everything is normal if you have not heard from your caregiver or the medical facility. It is important for you to follow up on all of your test results.  SEEK IMMEDIATE MEDICAL ATTENTION IF:  You have more than a spotting of blood in your stool.   Your belly is swollen (abdominal distention).   You are nauseated or vomiting.   You have a temperature over 101.   You have abdominal pain or discomfort that is severe or gets worse throughout the day.    Polyp information provided  Further recommendations to follow pending review of pathology report   Colon Polyps  Polyps are tissue growths inside the body. Polyps can grow in many places, including the large intestine (colon). A polyp may be a round bump or a mushroom-shaped growth. You could have one polyp or several. Most colon polyps are noncancerous (benign). However, some colon polyps can become cancerous over time. Finding and removing the polyps early can help prevent this. What are the causes? The exact cause of colon polyps is not known. What increases the risk? You are more likely to develop this condition if you:  Have a family history of colon cancer or colon polyps.  Are older than 20 or older than 45 if you are African American.  Have inflammatory bowel disease, such as ulcerative colitis or Crohn's disease.  Have certain hereditary conditions, such as: ?  Familial adenomatous polyposis. ? Lynch syndrome. ? Turcot syndrome. ? Peutz-Jeghers syndrome.  Are overweight.  Smoke cigarettes.  Do not get enough exercise.  Drink too much alcohol.  Eat a diet that is high in fat and red meat and low in fiber.  Had childhood cancer that was treated with abdominal radiation. What are the signs or symptoms? Most polyps do not cause symptoms. If you have symptoms, they may include:  Blood coming from your rectum when having a bowel movement.  Blood in your  stool. The stool may look dark red or black.  Abdominal pain.  A change in bowel habits, such as constipation or diarrhea. How is this diagnosed? This condition is diagnosed with a colonoscopy. This is a procedure in which a lighted, flexible scope is inserted into the anus and then passed into the colon to examine the area. Polyps are sometimes found when a colonoscopy is done as part of routine cancer screening tests. How is this treated? Treatment for this condition involves removing any polyps that are found. Most polyps can be removed during a colonoscopy. Those polyps will then be tested for cancer. Additional treatment may be needed depending on the results of testing. Follow these instructions at home: Lifestyle  Maintain a healthy weight, or lose weight if recommended by your health care provider.  Exercise every day or as told by your health care provider.  Do not use any products that contain nicotine or tobacco, such as cigarettes and e-cigarettes. If you need help quitting, ask your health care provider.  If you drink alcohol, limit how much you have: ? 0-1 drink a day for women. ? 0-2 drinks a day for men.  Be aware of how much alcohol is in your drink. In the [place] one drink equals one 12 oz bottle of beer (355 mL), one 5 oz glass of wine (148 mL), or one 1 oz shot of hard liquor (44 mL). Eating and drinking   Eat foods that are high in fiber, such as fruits, vegetables, and whole grains.  Eat foods that are high in calcium and vitamin D, such as milk, cheese, yogurt, eggs, liver, fish, and broccoli.  Limit foods that are high in fat, such as fried foods and desserts.  Limit the amount of red meat and processed meat you eat, such as hot dogs, sausage, bacon, and lunch meats. General instructions  Keep all follow-up visits as told by your health care provider. This is important. ? This includes having regularly scheduled colonoscopies. ? Talk to your health care  provider about when you need a colonoscopy. Contact a health care provider if:  You have new or worsening bleeding during a bowel movement.  You have new or increased blood in your stool.  You have a change in bowel habits.  You lose weight for no known reason. Summary  Polyps are tissue growths inside the body. Polyps can grow in many places, including the colon.  Most colon polyps are noncancerous (benign), but some can become cancerous over time.  This condition is diagnosed with a colonoscopy.  Treatment for this condition involves removing any polyps that are found. Most polyps can be removed during a colonoscopy. This information is not intended to replace advice given to you by your health care provider. Make sure you discuss any questions you have with your health care provider. Document Released: 05/02/2004 Document Revised: 11/21/2017 Document Reviewed: 11/21/2017 Elsevier Interactive Patient Education  2019 [employer]  Anesthesia is a term that refers to techniques, procedures, and medicines that help a person stay safe and comfortable during a medical procedure. Monitored anesthesia care, or sedation, is one type of anesthesia. Your anesthesia specialist may recommend sedation if you will be having a procedure that does not require you to be unconscious, such as:  Cataract surgery.  A dental procedure.  A biopsy.  A colonoscopy. During the procedure, you may receive a medicine to help you relax (sedative). There are three levels of sedation:  Mild sedation. At this level, you may feel awake and relaxed. You will be able to follow directions.  Moderate sedation. At this level, you will be sleepy. You may not remember the procedure.  Deep sedation. At this level, you will be asleep. You will not remember the procedure. The more medicine you are given, the deeper your level of sedation will be. Depending on how you respond to the  procedure, the anesthesia specialist may change your level of sedation or the type of anesthesia to fit your needs. An anesthesia specialist will monitor you closely during the procedure. Let your health care provider know about:  Any allergies you have.  All medicines you are taking, including vitamins, herbs, eye drops, creams, and over-the-counter medicines.  Any use of steroids (by mouth or as a cream).  Any problems you or family members have had with sedatives and anesthetic medicines.  Any blood disorders you have.  Any surgeries you have had.  Any medical conditions you have, such as sleep apnea.  Whether you are pregnant or may be pregnant.  Any use of cigarettes, alcohol, or street drugs. What are the risks? Generally, this is a safe procedure. However, problems may occur, including:  Getting too much medicine (oversedation).  Nausea.  Allergic reaction to medicines.  Trouble breathing. If this happens, a breathing tube may be used to help with breathing. It will be removed when you are awake and breathing on your own.  Heart trouble.  Lung trouble. Before the procedure Staying hydrated Follow instructions from your health care provider about hydration, which may include:  Up to 2 hours before the procedure - you may continue to drink clear liquids, such as water, clear fruit juice, black coffee, and plain tea. Eating and drinking restrictions Follow instructions from your health care provider about eating and drinking, which may include:  8 hours before the procedure - stop eating heavy meals or foods such as meat, fried foods, or fatty foods.  6 hours before the procedure - stop eating light meals or foods, such as toast or cereal.  6 hours before the procedure - stop drinking milk or drinks that contain milk.  2 hours before the procedure - stop drinking clear liquids. Medicines Ask your health care provider about:  Changing or stopping your regular  medicines. This is especially important if you are taking diabetes medicines or blood thinners.  Taking medicines such as aspirin and ibuprofen. These medicines can thin your blood. Do not take these medicines before your procedure if your health care provider instructs you not to. Tests and exams  You will have a physical exam.  You may have blood tests done to show: ? How well your kidneys and liver are working. ? How well your blood can clot. General instructions  Plan to have someone take you home from the hospital or clinic.  If you will be going home right after the procedure, plan to have someone with you for 24  hours.  What happens during the procedure?  Your blood pressure, heart rate, breathing, level of pain and overall condition will be monitored.  An IV tube will be inserted into one of your veins.  Your anesthesia specialist will give you medicines as needed to keep you comfortable during the procedure. This may mean changing the level of sedation.  The procedure will be performed. After the procedure  Your blood pressure, heart rate, breathing rate, and blood oxygen level will be monitored until the medicines you were given have worn off.  Do not drive for 24 hours if you received a sedative.  You may: ? Feel sleepy, clumsy, or nauseous. ? Feel forgetful about what happened after the procedure. ? Have a sore throat if you had a breathing tube during the procedure. ? Vomit. This information is not intended to replace advice given to you by your health care provider. Make sure you discuss any questions you have with your health care provider. Document Released: 05/02/2005 Document Revised: 01/13/2016 Document Reviewed: 11/27/2015 Elsevier Interactive Patient Education  2019 [employer].

## 2018-08-21 NOTE — Anesthesia Preprocedure Evaluation (Signed)
Anesthesia Evaluation  Patient identified by MRN, date of birth, ID band Patient awake    Reviewed: Allergy & Precautions, H&P , NPO status , Patient's Chart, lab work & pertinent test results  Airway Mallampati: III  TM Distance: >3 FB Neck ROM: full    Dental  (+) Partial Upper   Pulmonary neg pulmonary ROS,    Pulmonary exam normal breath sounds clear to auscultation       Cardiovascular Exercise Tolerance: Good hypertension, negative cardio ROS   Rhythm:regular Rate:Normal     Neuro/Psych negative neurological ROS  negative psych ROS   GI/Hepatic Neg liver ROS, PUD, GERD  ,  Endo/Other  diabetesHypothyroidism   Renal/GU negative Renal ROS  negative genitourinary   Musculoskeletal   Abdominal   Peds  Hematology negative hematology ROS (+)   Anesthesia Other Findings   Reproductive/Obstetrics negative OB ROS                             Anesthesia Physical Anesthesia Plan  ASA: III  Anesthesia Plan: MAC   Post-op Pain Management:    Induction:   PONV Risk Score and Plan:   Airway Management Planned:   Additional Equipment:   Intra-op Plan:   Post-operative Plan:   Informed Consent: I have reviewed the patients History and Physical, chart, labs and discussed the procedure including the risks, benefits and alternatives for the proposed anesthesia with the patient or authorized representative who has indicated his/her understanding and acceptance.     Plan Discussed with: CRNA  Anesthesia Plan Comments:         Anesthesia Quick Evaluation

## 2018-08-23 ENCOUNTER — Encounter: Payer: Self-pay | Admitting: Internal Medicine

## 2018-08-27 ENCOUNTER — Encounter (HOSPITAL_COMMUNITY): Payer: Self-pay | Admitting: Internal Medicine
